# Patient Record
Sex: Female | Born: 1945 | Race: White | Hispanic: No | Marital: Married | Smoking: Former smoker
Health system: Southern US, Community
[De-identification: ages and names within clinical notes are randomized; demographics above are authoritative.]

## PROBLEM LIST (undated history)

## (undated) DIAGNOSIS — F32A Depression, unspecified: Secondary | ICD-10-CM

## (undated) DIAGNOSIS — J449 Chronic obstructive pulmonary disease, unspecified: Secondary | ICD-10-CM

## (undated) DIAGNOSIS — K279 Peptic ulcer, site unspecified, unspecified as acute or chronic, without hemorrhage or perforation: Secondary | ICD-10-CM

## (undated) DIAGNOSIS — I219 Acute myocardial infarction, unspecified: Secondary | ICD-10-CM

## (undated) DIAGNOSIS — K219 Gastro-esophageal reflux disease without esophagitis: Secondary | ICD-10-CM

## (undated) DIAGNOSIS — R251 Tremor, unspecified: Secondary | ICD-10-CM

## (undated) DIAGNOSIS — K5909 Other constipation: Secondary | ICD-10-CM

## (undated) DIAGNOSIS — G8929 Other chronic pain: Secondary | ICD-10-CM

## (undated) DIAGNOSIS — T460X1A Poisoning by cardiac-stimulant glycosides and drugs of similar action, accidental (unintentional), initial encounter: Secondary | ICD-10-CM

## (undated) DIAGNOSIS — I509 Heart failure, unspecified: Secondary | ICD-10-CM

## (undated) DIAGNOSIS — I739 Peripheral vascular disease, unspecified: Secondary | ICD-10-CM

## (undated) DIAGNOSIS — M549 Dorsalgia, unspecified: Secondary | ICD-10-CM

## (undated) DIAGNOSIS — I2510 Atherosclerotic heart disease of native coronary artery without angina pectoris: Secondary | ICD-10-CM

## (undated) DIAGNOSIS — D696 Thrombocytopenia, unspecified: Secondary | ICD-10-CM

## (undated) DIAGNOSIS — R569 Unspecified convulsions: Secondary | ICD-10-CM

## (undated) DIAGNOSIS — Z515 Encounter for palliative care: Secondary | ICD-10-CM

## (undated) DIAGNOSIS — F329 Major depressive disorder, single episode, unspecified: Secondary | ICD-10-CM

## (undated) HISTORY — DX: Unspecified convulsions: R56.9

## (undated) HISTORY — DX: Dorsalgia, unspecified: M54.9

## (undated) HISTORY — DX: Acute myocardial infarction, unspecified: I21.9

## (undated) HISTORY — PX: TUBAL LIGATION: SHX77

## (undated) HISTORY — DX: Encounter for palliative care: Z51.5

## (undated) HISTORY — DX: Peptic ulcer, site unspecified, unspecified as acute or chronic, without hemorrhage or perforation: K27.9

## (undated) HISTORY — DX: Thrombocytopenia, unspecified: D69.6

## (undated) HISTORY — DX: Major depressive disorder, single episode, unspecified: F32.9

## (undated) HISTORY — DX: Other constipation: K59.09

## (undated) HISTORY — DX: Tremor, unspecified: R25.1

## (undated) HISTORY — DX: Other chronic pain: G89.29

## (undated) HISTORY — DX: Chronic obstructive pulmonary disease, unspecified: J44.9

## (undated) HISTORY — DX: Peripheral vascular disease, unspecified: I73.9

## (undated) HISTORY — DX: Poisoning by cardiac-stimulant glycosides and drugs of similar action, accidental (unintentional), initial encounter: T46.0X1A

## (undated) HISTORY — DX: Atherosclerotic heart disease of native coronary artery without angina pectoris: I25.10

## (undated) HISTORY — PX: ANGIOPLASTY: SHX39

## (undated) HISTORY — PX: US ECHOCARDIOGRAPHY: HXRAD669

## (undated) HISTORY — DX: Heart failure, unspecified: I50.9

## (undated) HISTORY — DX: Depression, unspecified: F32.A

## (undated) HISTORY — DX: Gastro-esophageal reflux disease without esophagitis: K21.9

---

## 1995-06-03 HISTORY — PX: AORTO-FEMORAL BYPASS GRAFT: SHX885

## 2001-01-31 HISTORY — PX: CHOLECYSTECTOMY: SHX55

## 2001-06-02 DIAGNOSIS — R569 Unspecified convulsions: Secondary | ICD-10-CM

## 2001-06-02 HISTORY — DX: Unspecified convulsions: R56.9

## 2001-09-30 DIAGNOSIS — I509 Heart failure, unspecified: Secondary | ICD-10-CM

## 2001-09-30 HISTORY — DX: Heart failure, unspecified: I50.9

## 2001-10-31 DIAGNOSIS — T460X1A Poisoning by cardiac-stimulant glycosides and drugs of similar action, accidental (unintentional), initial encounter: Secondary | ICD-10-CM

## 2001-10-31 HISTORY — DX: Poisoning by cardiac-stimulant glycosides and drugs of similar action, accidental (unintentional), initial encounter: T46.0X1A

## 2004-06-04 ENCOUNTER — Ambulatory Visit: Payer: Self-pay | Admitting: Internal Medicine

## 2004-09-06 ENCOUNTER — Ambulatory Visit: Payer: Self-pay | Admitting: Internal Medicine

## 2004-11-05 ENCOUNTER — Ambulatory Visit: Payer: Self-pay | Admitting: Internal Medicine

## 2004-11-06 ENCOUNTER — Ambulatory Visit: Payer: Self-pay | Admitting: Internal Medicine

## 2004-12-23 ENCOUNTER — Ambulatory Visit: Payer: Self-pay | Admitting: Internal Medicine

## 2005-03-28 ENCOUNTER — Ambulatory Visit: Payer: Self-pay | Admitting: Internal Medicine

## 2005-05-19 ENCOUNTER — Ambulatory Visit: Payer: Self-pay | Admitting: Internal Medicine

## 2005-06-30 ENCOUNTER — Ambulatory Visit: Payer: Self-pay | Admitting: Internal Medicine

## 2005-09-29 ENCOUNTER — Ambulatory Visit: Payer: Self-pay | Admitting: Internal Medicine

## 2005-12-29 ENCOUNTER — Ambulatory Visit: Payer: Self-pay | Admitting: Internal Medicine

## 2006-03-30 ENCOUNTER — Ambulatory Visit: Payer: Self-pay | Admitting: Internal Medicine

## 2006-06-02 DIAGNOSIS — K279 Peptic ulcer, site unspecified, unspecified as acute or chronic, without hemorrhage or perforation: Secondary | ICD-10-CM

## 2006-06-02 HISTORY — DX: Peptic ulcer, site unspecified, unspecified as acute or chronic, without hemorrhage or perforation: K27.9

## 2006-06-29 ENCOUNTER — Ambulatory Visit: Payer: Self-pay | Admitting: Internal Medicine

## 2006-06-29 LAB — CONVERTED CEMR LAB
Calcium: 9.1 mg/dL (ref 8.4–10.5)
Cholesterol: 202 mg/dL (ref 0–200)
Creatinine,U: 329.3 mg/dL
Direct LDL: 149.2 mg/dL
GFR calc Af Amer: 94 mL/min
GFR calc non Af Amer: 78 mL/min
Glucose, Bld: 121 mg/dL — ABNORMAL HIGH (ref 70–99)
Hgb A1c MFr Bld: 5.5 % (ref 4.6–6.0)
Potassium: 3.8 meq/L (ref 3.5–5.1)
Triglycerides: 112 mg/dL (ref 0–149)
VLDL: 22 mg/dL (ref 0–40)

## 2006-09-07 DIAGNOSIS — F3340 Major depressive disorder, recurrent, in remission, unspecified: Secondary | ICD-10-CM

## 2006-09-07 DIAGNOSIS — I2510 Atherosclerotic heart disease of native coronary artery without angina pectoris: Secondary | ICD-10-CM

## 2006-09-07 DIAGNOSIS — K219 Gastro-esophageal reflux disease without esophagitis: Secondary | ICD-10-CM

## 2006-09-07 DIAGNOSIS — E7800 Pure hypercholesterolemia, unspecified: Secondary | ICD-10-CM

## 2006-09-07 DIAGNOSIS — I498 Other specified cardiac arrhythmias: Secondary | ICD-10-CM | POA: Insufficient documentation

## 2006-09-07 DIAGNOSIS — J4489 Other specified chronic obstructive pulmonary disease: Secondary | ICD-10-CM | POA: Insufficient documentation

## 2006-09-07 DIAGNOSIS — I739 Peripheral vascular disease, unspecified: Secondary | ICD-10-CM | POA: Insufficient documentation

## 2006-09-07 DIAGNOSIS — J449 Chronic obstructive pulmonary disease, unspecified: Secondary | ICD-10-CM

## 2006-09-28 ENCOUNTER — Ambulatory Visit: Payer: Self-pay | Admitting: Internal Medicine

## 2006-09-28 DIAGNOSIS — M549 Dorsalgia, unspecified: Secondary | ICD-10-CM

## 2006-09-28 DIAGNOSIS — G8929 Other chronic pain: Secondary | ICD-10-CM

## 2006-12-22 ENCOUNTER — Encounter (INDEPENDENT_AMBULATORY_CARE_PROVIDER_SITE_OTHER): Payer: Self-pay | Admitting: *Deleted

## 2006-12-28 ENCOUNTER — Ambulatory Visit: Payer: Self-pay | Admitting: Internal Medicine

## 2006-12-28 LAB — CONVERTED CEMR LAB
ALT: 12 units/L (ref 0–35)
Albumin: 3.8 g/dL (ref 3.5–5.2)
Basophils Absolute: 0 10*3/uL (ref 0.0–0.1)
Basophils Relative: 0.3 % (ref 0.0–1.0)
Bilirubin, Direct: 0.2 mg/dL (ref 0.0–0.3)
Calcium: 9.4 mg/dL (ref 8.4–10.5)
Eosinophils Absolute: 0.3 10*3/uL (ref 0.0–0.6)
Eosinophils Relative: 2.9 % (ref 0.0–5.0)
GFR calc Af Amer: 73 mL/min
Hemoglobin: 14.9 g/dL (ref 12.0–15.0)
Hgb A1c MFr Bld: 5.5 % (ref 4.6–6.0)
Lymphocytes Relative: 27.2 % (ref 12.0–46.0)
MCHC: 34.5 g/dL (ref 30.0–36.0)
MCV: 97.4 fL (ref 78.0–100.0)
Monocytes Absolute: 1.1 10*3/uL — ABNORMAL HIGH (ref 0.2–0.7)
Monocytes Relative: 11.1 % — ABNORMAL HIGH (ref 3.0–11.0)
Neutro Abs: 5.7 10*3/uL (ref 1.4–7.7)
Neutrophils Relative %: 58.5 % (ref 43.0–77.0)
Phosphorus: 3.6 mg/dL (ref 2.3–4.6)
Platelets: 183 10*3/uL (ref 150–400)
Total Bilirubin: 0.9 mg/dL (ref 0.3–1.2)
Total Protein: 7.2 g/dL (ref 6.0–8.3)
WBC: 9.7 10*3/uL (ref 4.5–10.5)

## 2007-01-05 ENCOUNTER — Telehealth (INDEPENDENT_AMBULATORY_CARE_PROVIDER_SITE_OTHER): Payer: Self-pay | Admitting: *Deleted

## 2007-01-20 ENCOUNTER — Ambulatory Visit: Payer: Self-pay | Admitting: Internal Medicine

## 2007-01-20 DIAGNOSIS — K279 Peptic ulcer, site unspecified, unspecified as acute or chronic, without hemorrhage or perforation: Secondary | ICD-10-CM | POA: Insufficient documentation

## 2007-03-29 ENCOUNTER — Ambulatory Visit: Payer: Self-pay | Admitting: Internal Medicine

## 2007-06-28 ENCOUNTER — Ambulatory Visit: Payer: Self-pay | Admitting: Internal Medicine

## 2007-06-29 LAB — CONVERTED CEMR LAB
Hgb A1c MFr Bld: 5.6 % (ref 4.6–6.0)
Phosphorus: 3.7 mg/dL (ref 2.3–4.6)

## 2007-08-12 ENCOUNTER — Ambulatory Visit: Payer: Self-pay | Admitting: Family Medicine

## 2007-08-16 ENCOUNTER — Ambulatory Visit: Payer: Self-pay | Admitting: Internal Medicine

## 2007-09-28 ENCOUNTER — Ambulatory Visit: Payer: Self-pay | Admitting: Internal Medicine

## 2007-09-30 LAB — CONVERTED CEMR LAB
Albumin: 3.7 g/dL (ref 3.5–5.2)
Basophils Absolute: 0.1 10*3/uL (ref 0.0–0.1)
Basophils Relative: 2.2 % — ABNORMAL HIGH (ref 0.0–1.0)
CO2: 34 meq/L — ABNORMAL HIGH (ref 19–32)
Chloride: 103 meq/L (ref 96–112)
Creatinine, Ser: 0.7 mg/dL (ref 0.4–1.2)
Eosinophils Absolute: 0.3 10*3/uL (ref 0.0–0.7)
HCT: 46 % (ref 36.0–46.0)
Hemoglobin: 15.2 g/dL — ABNORMAL HIGH (ref 12.0–15.0)
Hgb A1c MFr Bld: 5.6 % (ref 4.6–6.0)
MCHC: 33.1 g/dL (ref 30.0–36.0)
MCV: 98.2 fL (ref 78.0–100.0)
Neutro Abs: 3.1 10*3/uL (ref 1.4–7.7)
Neutrophils Relative %: 46.1 % (ref 43.0–77.0)
Potassium: 3.9 meq/L (ref 3.5–5.1)
RDW: 12.6 % (ref 11.5–14.6)
Sodium: 142 meq/L (ref 135–145)
TSH: 2.66 microintl units/mL (ref 0.35–5.50)
WBC: 6.6 10*3/uL (ref 4.5–10.5)

## 2007-12-21 ENCOUNTER — Ambulatory Visit: Payer: Self-pay | Admitting: Internal Medicine

## 2008-03-22 ENCOUNTER — Ambulatory Visit: Payer: Self-pay | Admitting: Internal Medicine

## 2008-03-29 ENCOUNTER — Telehealth: Payer: Self-pay | Admitting: Internal Medicine

## 2008-04-12 ENCOUNTER — Telehealth: Payer: Self-pay | Admitting: Family Medicine

## 2008-06-26 ENCOUNTER — Ambulatory Visit: Payer: Self-pay | Admitting: Internal Medicine

## 2008-06-26 LAB — HM DIABETES FOOT EXAM

## 2008-06-27 LAB — CONVERTED CEMR LAB
Basophils Absolute: 0.1 10*3/uL (ref 0.0–0.1)
CO2: 35 meq/L — ABNORMAL HIGH (ref 19–32)
Calcium: 9.4 mg/dL (ref 8.4–10.5)
Creatinine, Ser: 0.8 mg/dL (ref 0.4–1.2)
Eosinophils Absolute: 0.2 10*3/uL (ref 0.0–0.7)
GFR calc non Af Amer: 78 mL/min
Glucose, Bld: 109 mg/dL — ABNORMAL HIGH (ref 70–99)
HCT: 43.4 % (ref 36.0–46.0)
Hemoglobin: 15 g/dL (ref 12.0–15.0)
Hgb A1c MFr Bld: 5.6 % (ref 4.6–6.0)
Monocytes Absolute: 0.4 10*3/uL (ref 0.1–1.0)
Neutrophils Relative %: 54.6 % (ref 43.0–77.0)
Potassium: 4.4 meq/L (ref 3.5–5.1)
TSH: 0.89 microintl units/mL (ref 0.35–5.50)
WBC: 6.3 10*3/uL (ref 4.5–10.5)

## 2008-07-13 ENCOUNTER — Encounter: Payer: Self-pay | Admitting: Internal Medicine

## 2008-07-20 ENCOUNTER — Telehealth: Payer: Self-pay | Admitting: Internal Medicine

## 2008-09-14 ENCOUNTER — Telehealth: Payer: Self-pay | Admitting: Internal Medicine

## 2008-09-18 ENCOUNTER — Ambulatory Visit: Payer: Self-pay | Admitting: Internal Medicine

## 2008-12-21 ENCOUNTER — Telehealth: Payer: Self-pay | Admitting: Internal Medicine

## 2008-12-25 ENCOUNTER — Ambulatory Visit: Payer: Self-pay | Admitting: Internal Medicine

## 2008-12-27 LAB — CONVERTED CEMR LAB
AST: 24 units/L (ref 0–37)
Alkaline Phosphatase: 82 units/L (ref 39–117)
Bilirubin, Direct: 0.2 mg/dL (ref 0.0–0.3)
Chloride: 97 meq/L (ref 96–112)
Eosinophils Absolute: 0.3 10*3/uL (ref 0.0–0.7)
Eosinophils Relative: 4.4 % (ref 0.0–5.0)
HCT: 42.9 % (ref 36.0–46.0)
Hemoglobin: 14.9 g/dL (ref 12.0–15.0)
Hgb A1c MFr Bld: 5.4 % (ref 4.6–6.5)
MCHC: 34.8 g/dL (ref 30.0–36.0)
MCV: 97.8 fL (ref 78.0–100.0)
Neutro Abs: 3.7 10*3/uL (ref 1.4–7.7)
Platelets: 142 10*3/uL — ABNORMAL LOW (ref 150.0–400.0)
Potassium: 4.1 meq/L (ref 3.5–5.1)
RBC: 4.38 M/uL (ref 3.87–5.11)
RDW: 12.2 % (ref 11.5–14.6)
Sodium: 140 meq/L (ref 135–145)
TSH: 2.3 microintl units/mL (ref 0.35–5.50)
Total Bilirubin: 0.9 mg/dL (ref 0.3–1.2)
WBC: 6.4 10*3/uL (ref 4.5–10.5)

## 2009-02-21 ENCOUNTER — Telehealth: Payer: Self-pay | Admitting: Internal Medicine

## 2009-02-28 ENCOUNTER — Encounter: Payer: Self-pay | Admitting: Internal Medicine

## 2009-03-16 ENCOUNTER — Ambulatory Visit: Payer: Self-pay | Admitting: Internal Medicine

## 2009-03-16 DIAGNOSIS — I5022 Chronic systolic (congestive) heart failure: Secondary | ICD-10-CM

## 2009-04-02 ENCOUNTER — Telehealth: Payer: Self-pay | Admitting: Internal Medicine

## 2009-04-02 ENCOUNTER — Encounter (INDEPENDENT_AMBULATORY_CARE_PROVIDER_SITE_OTHER): Payer: Self-pay | Admitting: *Deleted

## 2009-04-13 ENCOUNTER — Ambulatory Visit: Payer: Self-pay | Admitting: Gastroenterology

## 2009-04-16 ENCOUNTER — Telehealth: Payer: Self-pay | Admitting: Gastroenterology

## 2009-04-23 ENCOUNTER — Ambulatory Visit: Payer: Self-pay | Admitting: Gastroenterology

## 2009-05-02 ENCOUNTER — Telehealth: Payer: Self-pay | Admitting: Internal Medicine

## 2009-05-03 ENCOUNTER — Ambulatory Visit (HOSPITAL_COMMUNITY): Admission: RE | Admit: 2009-05-03 | Discharge: 2009-05-03 | Payer: Self-pay | Admitting: Gastroenterology

## 2009-05-03 ENCOUNTER — Telehealth: Payer: Self-pay | Admitting: Gastroenterology

## 2009-05-04 ENCOUNTER — Telehealth: Payer: Self-pay | Admitting: Gastroenterology

## 2009-06-15 ENCOUNTER — Ambulatory Visit: Payer: Self-pay | Admitting: Internal Medicine

## 2009-06-18 ENCOUNTER — Telehealth: Payer: Self-pay | Admitting: Internal Medicine

## 2009-09-10 ENCOUNTER — Ambulatory Visit: Payer: Self-pay | Admitting: Internal Medicine

## 2009-09-10 LAB — CONVERTED CEMR LAB
AST: 18 units/L (ref 0–37)
BUN: 9 mg/dL (ref 6–23)
Basophils Absolute: 0 10*3/uL (ref 0.0–0.1)
Basophils Relative: 0.5 % (ref 0.0–3.0)
CO2: 36 meq/L — ABNORMAL HIGH (ref 19–32)
Calcium: 8.8 mg/dL (ref 8.4–10.5)
Chloride: 96 meq/L (ref 96–112)
Creatinine, Ser: 0.8 mg/dL (ref 0.4–1.2)
GFR calc non Af Amer: 77.09 mL/min (ref >60)
Lymphocytes Relative: 31.6 % (ref 12.0–46.0)
MCHC: 35 g/dL (ref 30.0–36.0)
MCV: 98.6 fL (ref 78.0–100.0)
Neutro Abs: 3.9 10*3/uL (ref 1.4–7.7)
Neutrophils Relative %: 56.4 % (ref 43.0–77.0)
Potassium: 3.5 meq/L (ref 3.5–5.1)
RBC: 4.05 M/uL (ref 3.87–5.11)
Sodium: 140 meq/L (ref 135–145)
TSH: 2.19 microintl units/mL (ref 0.35–5.50)
Total Protein: 7.3 g/dL (ref 6.0–8.3)

## 2009-11-08 ENCOUNTER — Telehealth: Payer: Self-pay | Admitting: Gastroenterology

## 2009-11-08 ENCOUNTER — Ambulatory Visit: Payer: Self-pay | Admitting: Gastroenterology

## 2009-11-15 ENCOUNTER — Telehealth: Payer: Self-pay | Admitting: Gastroenterology

## 2009-11-15 ENCOUNTER — Ambulatory Visit: Payer: Self-pay | Admitting: Gastroenterology

## 2009-11-15 ENCOUNTER — Ambulatory Visit (HOSPITAL_COMMUNITY): Admission: RE | Admit: 2009-11-15 | Discharge: 2009-11-15 | Payer: Self-pay | Admitting: Gastroenterology

## 2009-11-16 ENCOUNTER — Encounter (INDEPENDENT_AMBULATORY_CARE_PROVIDER_SITE_OTHER): Payer: Self-pay | Admitting: *Deleted

## 2009-11-22 ENCOUNTER — Encounter: Payer: Self-pay | Admitting: Gastroenterology

## 2009-11-23 ENCOUNTER — Ambulatory Visit: Payer: Self-pay | Admitting: Internal Medicine

## 2009-11-23 LAB — CONVERTED CEMR LAB
Glucose, Urine, Semiquant: NEGATIVE
Ketones, urine, test strip: NEGATIVE
Urobilinogen, UA: 0.2
pH: 8

## 2009-11-29 ENCOUNTER — Ambulatory Visit (HOSPITAL_COMMUNITY): Admission: RE | Admit: 2009-11-29 | Discharge: 2009-11-29 | Payer: Self-pay | Admitting: Gastroenterology

## 2009-12-10 ENCOUNTER — Ambulatory Visit: Payer: Self-pay | Admitting: Internal Medicine

## 2010-03-14 ENCOUNTER — Ambulatory Visit: Payer: Self-pay | Admitting: Internal Medicine

## 2010-06-14 ENCOUNTER — Ambulatory Visit
Admission: RE | Admit: 2010-06-14 | Discharge: 2010-06-14 | Payer: Self-pay | Source: Home / Self Care | Attending: Internal Medicine | Admitting: Internal Medicine

## 2010-06-14 DIAGNOSIS — G252 Other specified forms of tremor: Secondary | ICD-10-CM

## 2010-06-14 DIAGNOSIS — G250 Essential tremor: Secondary | ICD-10-CM | POA: Insufficient documentation

## 2010-07-02 NOTE — Assessment & Plan Note (Signed)
Summary: ? UTI   Vital Signs:  Patient profile:   65 year old female Weight:      156 pounds O2 Sat:      96 % on 2 L/min Temp:     98.9 degrees F oral Pulse rate:   122 / minute Pulse rhythm:   regular BP sitting:   100 / 60  (left arm) Cuff size:   regular  Vitals Entered By: Mervin Hack CMA Duncan Dull) (November 23, 2009 11:16 AM)  O2 Flow:  2 L/min CC: UTI   History of Present Illness: Started feeling sick upon awakening 2 days ago bad dysuria  Slight blood tinging in urine No fever but hasn't felt well--just lying around all week  Nausea able to eat some  Hasn't used any meds for this did use cranberry juice in the past--not lately  Allergies: No Known Drug Allergies  Past History:  Past medical, surgical, family and social histories (including risk factors) reviewed for relevance to current acute and chronic problems.  Past Medical History: Reviewed history from 11/08/2009 and no changes required. Congestive heart failure ?diastolic (with SVT) 5/03 COPD Coronary artery disease Depression GERD Peripheral vascular disease Peptic ulcer disease--2008   + H pylori and classic symptoms (antibiotics given)  Also in 20's Chronic back pain Chronic constipation  Past Surgical History: Reviewed history from 04/13/2009 and no changes required. Cholecystectomy 9/02 1997 Aorto-bifem Hysterectomy  MI multiple Multiple angioplasties  Tubal ligation Seizure vs TIA 2003 Echo- EF 77% 6/03  dig toxicity Hospice due to severe CHF 2003-----probably due to actos (or avandia)  Family History: Reviewed history from 04/13/2009 and no changes required. Nieces with cystic fibrosis Soster with pulmonary fibrosis Family History of Breast Cancer: Sister No FH of Colon Cancer: Family History of Diabetes: Brother Family History of Heart Disease: Mother  Social History: Reviewed history from 04/13/2009 and no changes required. Retired--textile worker Married--2  children Former Smoker-stopped 3.5 years ago Alcohol use-no Illicit Drug Use - no Patient does not get regular exercise.   Review of Systems       just had endoscopy ongoing eval by Dr Arlyce Dice  Physical Exam  General:  alert.  Appears mildly ill but no distress Neck:  supple and no cervical lymphadenopathy.   Abdomen:  soft and non-tender.   Msk:  no sig CVA tenderness   Impression & Recommendations:  Problem # 1:  UTI (ICD-599.0) Assessment New  dysuria and mild systemic symptoms  will treat with amoxil phenergan as needed   Her updated medication list for this problem includes:    Amoxicillin 500 Mg Tabs (Amoxicillin) .Marland Kitchen... 1 tab by mouth two times a day for urinary tract infection  Orders: UA Dipstick w/o Micro (manual) ([address])  Complete Medication List: 1)  Omeprazole 20 Mg Cpdr (Omeprazole) .... Take one by mouth two times a day 2)  Paroxetine Hcl 40 Mg Tabs (Paroxetine hcl) .... Take one by mouth once a day 3)  Morphine Sulfate 15 Mg Tabs (Morphine sulfate) .... One tablet by mouth ever morining and at bedtime 4)  Lasix 40 Mg Tabs (Furosemide) .... Take one by mouth once a day as needed 5)  Mineral Oil Oil (Mineral oil) .... Take 1 teaspoon at bedtime 6)  Nebulizer Misc (Nebulizers) .... As needed 7)  Miralax Powd (Polyethylene glycol 3350) .Marland Kitchen.. 1 capful with water two times a day 8)  Aspirin Low Dose 81 Mg Tabs (Aspirin) .... Take one by mouth once a day 9)  Senokot S  8.6-50 Mg Tabs (Sennosides-docusate sodium) .... Take 2 tabs two times a day 10)  Amoxicillin 500 Mg Tabs (Amoxicillin) .Marland Kitchen.. 1 tab by mouth two times a day for urinary tract infection 11)  Promethazine Hcl 25 Mg Tabs (Promethazine hcl) .... 1/2-1 tab by mouth three times a day as needed for nausea  Patient Instructions: 1)  Please call if urinary symptoms not better by Monday 2)  Keep regular follow up appt Prescriptions: PROMETHAZINE HCL 25 MG TABS (PROMETHAZINE HCL) 1/2-1 tab by mouth three  times a day as needed for nausea  #20 x 0   Entered and Authorized by:   Cindee Salt MD   Signed by:   Cindee Salt MD on 11/23/2009   Method used:   Electronically to        [facility]  [address] [phone]* (retail)       [address]       [place], [place]  [address]       Ph: [phone]       Fax: [phone]   RxID:   [id number] AMOXICILLIN 500 MG TABS (AMOXICILLIN) 1 tab by mouth two times a day for urinary tract infection  #20 x 0   Entered and Authorized by:   Cindee Salt MD   Signed by:   Cindee Salt MD on 11/23/2009   Method used:   Electronically to        [facility]  [address] [phone]* (retail)       [address]       [place], [place]  [address]       Ph: [phone]       Fax: [phone]   RxID:   [id number]   Laboratory Results   Urine Tests  Date/Time Received: November 23, 2009 11:13 AM Date/Time Reported: November 23, 2009 11:13 AM  Routine Urinalysis   Color: orange Appearance: Cloudy Glucose: negative   (Normal Range: Negative) Bilirubin: large   (Normal Range: Negative) Ketone: negative   (Normal Range: Negative) Spec. Gravity: >=1.030   (Normal Range: 1.003-1.035) Blood: large   (Normal Range: Negative) pH: 8.0   (Normal Range: 5.0-8.0) Protein: 30   (Normal Range: Negative) Urobilinogen: 0.2   (Normal Range: 0-1) Nitrite: negative   (Normal Range: Negative) Leukocyte Esterace: moderate   (Normal Range: Negative)        Current Allergies (reviewed today): No known allergies

## 2010-07-02 NOTE — Assessment & Plan Note (Signed)
Summary: 3 MONTH FOLLOW UP/[facility]   Vital Signs:  Patient profile:   65 year old female Weight:      158 pounds O2 Sat:      96 % on 2 L/min Temp:     98.3 degrees F oral Pulse rate:   98 / minute Pulse rhythm:   regular Resp:     18 per minute BP sitting:   100 / 70  (left arm) Cuff size:   regular  Vitals Entered By: Mervin Hack CMA [facility] Dull) (June 15, 2009 2:42 PM)  O2 Flow:  2 L/min CC: 3 month follow-up   History of Present Illness: Doing well  stomach has settled down had GI eval and then colonoscopy Actually lost 8# in 1 day due to the vomiting from the moviprep Abd pain is better hyomax really helps  Breathing still not great able to do about the same Not able to do housework for the most part Can briefly carry light groceries Needs cough medicine occ  Chronic back pain still controlled with current morphine  sugars remain normal at this point checks about once a week  No problems with depression  Allergies: No Known Drug Allergies  Past History:  Past medical, surgical, family and social histories (including risk factors) reviewed for relevance to current acute and chronic problems.  Past Medical History: Reviewed history from 12/21/2007 and no changes required. Congestive heart failure ?diastolic (with SVT) 5/03 COPD Coronary artery disease Depression Diabetes mellitus, type II GERD Peripheral vascular disease Peptic ulcer disease--2008   + H pylori and classic symptoms (antibiotics given)  Also in 20's Chronic back pain  Past Surgical History: Reviewed history from 04/13/2009 and no changes required. Cholecystectomy 9/02 1997 Aorto-bifem Hysterectomy  MI multiple Multiple angioplasties  Tubal ligation Seizure vs TIA 2003 Echo- EF 77% 6/03  dig toxicity Hospice due to severe CHF 2003-----probably due to actos (or avandia)  Family History: Reviewed history from 04/13/2009 and no changes required. Nieces with cystic  fibrosis Soster with pulmonary fibrosis Family History of Breast Cancer: Sister No FH of Colon Cancer: Family History of Diabetes: Brother Family History of Heart Disease: Mother  Social History: Reviewed history from 04/13/2009 and no changes required. 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   Review of Systems       weight down 7# from last time--thinks this is closer to her dry baseline (had held off on diuretic before last visit) sleeps okay  Physical Exam  General:  alert.  NAD Neck:  supple, no masses, no thyromegaly, no carotid bruits, and no cervical lymphadenopathy.   Lungs:  normal respiratory effort, no intercostal retractions, no accessory muscle use, no crackles, and no wheezes.  Decreased breath sounds but clear Heart:  normal rate, regular rhythm, no murmur, and no gallop.   Abdomen:  soft and non-tender.   Extremities:  no edema Psych:  normally interactive, good eye contact, not anxious appearing, and not depressed appearing.     Impression & Recommendations:  Problem # 1:  CHRONIC SYSTOLIC HEART FAILURE (ICD-428.22) Assessment Comment Only stable status no changes needed  Her updated medication list for this problem includes:    Toprol Xl 50 Mg Tb24 (Metoprolol succinate) .Marland Kitchen... Take one by mouth once a day    Aspirin Low Dose 81 Mg Tabs (Aspirin) .Marland Kitchen... Take one by mouth once a day    Lasix 40 Mg Tabs (Furosemide) .Marland KitchenMarland KitchenMarland KitchenMarland Kitchen  Take one by mouth once a day as needed  Problem # 2:  BACK PAIN, CHRONIC (ICD-724.5) Assessment: Unchanged doing okay on the morphine  Her updated medication list for this problem includes:    Ms Contin 60 Mg Tb12 (Morphine sulfate) .Marland Kitchen... 1 two times a day    Aspirin Low Dose 81 Mg Tabs (Aspirin) .Marland Kitchen... Take one by mouth once a day    Ms Contin 60 Mg Xr12h-tab (Morphine sulfate) .Marland Kitchen... 1 two times a day  fill in about 1  month    Ms Contin 60 Mg Xr12h-tab (Morphine sulfate) .Marland Kitchen... 1 two times a day  fill in about 2 months    Morphine Sulfate 15 Mg Tabs (Morphine sulfate) .Marland Kitchen... 1 by mouth as needed for chest pain unresponsive to nitroglycerin  Problem # 3:  COPD (ICD-496) Assessment: Unchanged stable status moderate restriction on oxygen  Problem # 4:  CORONARY ARTERY DISEASE (ICD-414.00) Assessment: Unchanged quiet  Her updated medication list for this problem includes:    Toprol Xl 50 Mg Tb24 (Metoprolol succinate) .Marland Kitchen... Take one by mouth once a day    Aspirin Low Dose 81 Mg Tabs (Aspirin) .Marland Kitchen... Take one by mouth once a day    Lasix 40 Mg Tabs (Furosemide) .Marland Kitchen... Take one by mouth once a day as needed  Problem # 5:  DEPRESSION (ICD-311) Assessment: Unchanged doing okay  Her updated medication list for this problem includes:    Paroxetine Hcl 40 Mg Tabs (Paroxetine hcl) .Marland Kitchen... Take one by mouth once a day  Complete Medication List: 1)  Omeprazole 20 Mg Cpdr (Omeprazole) .... Take one by mouth two times a day 2)  Paroxetine Hcl 40 Mg Tabs (Paroxetine hcl) .... Take one by mouth once a day 3)  Toprol Xl 50 Mg Tb24 (Metoprolol succinate) .... Take one by mouth once a day 4)  Ms Contin 60 Mg Tb12 (Morphine sulfate) .Marland Kitchen.. 1 two times a day 5)  Aspirin Low Dose 81 Mg Tabs (Aspirin) .... Take one by mouth once a day 6)  Senokot S 8.6-50 Mg Tabs (Sennosides-docusate sodium) .... Take 2 tabs two times a day 7)  Oxygen  8)  Lasix 40 Mg Tabs (Furosemide) .... Take one by mouth once a day as needed 9)  Ms Contin 60 Mg Xr12h-tab (Morphine sulfate) .Marland Kitchen.. 1 two times a day  fill in about 1 month 10)  Ms Contin 60 Mg Xr12h-tab (Morphine sulfate) .Marland Kitchen.. 1 two times a day  fill in about 2 months 11)  Morphine Sulfate 15 Mg Tabs (Morphine sulfate) .Marland Kitchen.. 1 by mouth as needed for chest pain unresponsive to nitroglycerin 12)  Mineral Oil Oil (Mineral oil) .... Take 1 teaspoon at bedtime 13)  Nebulizer Misc (Nebulizers) ....  As needed 14)  Miralax Powd (Polyethylene glycol 3350) .Marland Kitchen.. 1 capful with water two times a day 15)  Ondansetron 4 Mg Tbdp (Ondansetron) .... Take one by mouth every 6 hours as needed for nausea. 16)  Hydrocodone-homatropine 5-1.5 Mg/50ml Syrp (Hydrocodone-homatropine) .Marland Kitchen.. 1-2 teaspoons at bedtime as needed for severe cough  Patient Instructions: 1)  Please schedule a follow-up appointment in 3 months .  Prescriptions: MS CONTIN 60 MG  XR12H-TAB (MORPHINE SULFATE) 1 two times a day  FILL IN ABOUT 2 MONTHS  #60 x 0   Entered and Authorized by:   Cindee Salt MD   Signed by:   Cindee Salt MD on 06/15/2009   Method used:   Print then Give to Patient  RxID:   [id number] MS CONTIN 60 MG  XR12H-TAB (MORPHINE SULFATE) 1 two times a day  FILL IN ABOUT 1 MONTH  #60 x 0   Entered and Authorized by:   Cindee Salt MD   Signed by:   Cindee Salt MD on 06/15/2009   Method used:   Print then Give to Patient   RxID:   [id number] MS CONTIN 60 MG TB12 (MORPHINE SULFATE) 1 two times a day  #60 x 0   Entered and Authorized by:   Cindee Salt MD   Signed by:   Cindee Salt MD on 06/15/2009   Method used:   Print then Give to Patient   RxID:   [id number] HYDROCODONE-HOMATROPINE 5-1.5 MG/5ML SYRP (HYDROCODONE-HOMATROPINE) 1-2 teaspoons at bedtime as needed for severe cough  #8oz x 0   Entered and Authorized by:   Cindee Salt MD   Signed by:   Cindee Salt MD on 06/15/2009   Method used:   Print then Give to Patient   RxID:   [id number] TOPROL XL 50 MG TB24 (METOPROLOL SUCCINATE) Take one by mouth once a day  #90 x 3   Entered by:   Mervin Hack CMA (AAMA)   Authorized by:   Cindee Salt MD   Signed by:   Mervin Hack CMA (AAMA) on 06/15/2009   Method used:   Print then Give to Patient   RxID:   [id number] PAROXETINE HCL 40 MG TABS (PAROXETINE HCL) Take one by mouth once a day  #90 x 3   Entered by:   Mervin Hack CMA (AAMA)   Authorized by:   Cindee Salt MD   Signed by:   Mervin Hack CMA (AAMA) on 06/15/2009   Method used:   Print then Give to Patient   RxID:   [id number] OMEPRAZOLE 20 MG CPDR (OMEPRAZOLE) Take one by mouth two times a day  #180 x 3   Entered by:   Mervin Hack CMA (AAMA)   Authorized by:   Cindee Salt MD   Signed by:   Mervin Hack CMA (AAMA) on 06/15/2009   Method used:   Print then Give to Patient   RxID:   [id number]   Current Allergies (reviewed today): No known allergies

## 2010-07-02 NOTE — Assessment & Plan Note (Signed)
Summary: 3 M F/U DLO   Vital Signs:  Patient profile:   65 year old female Weight:      150 pounds O2 Sat:      97 % on 2 L/min Temp:     98.5 degrees F oral Pulse rate:   64 / minute Pulse rhythm:   regular BP sitting:   100 / 60  (left arm) Cuff size:   regular  Vitals Entered By: Mervin Hack CMA [facility] Dull) (March 14, 2010 4:07 PM)  O2 Flow:  2 L/min CC: 3 month follow-up   History of Present Illness: Doing okay No new concerns  still satisfied with pain control regular with the meds  stomach much better since on the reglan not much appetite   Breathing is about the same still with limited exercise tolerance Can walk from car to house if she takes it slow. Dyspnea if she carries anything No cough  No chest pain No palpitations No sig edema  Allergies: No Known Drug Allergies  Past History:  Past medical, surgical, family and social histories (including risk factors) reviewed for relevance to current acute and chronic problems.  Past Medical History: Reviewed history from 11/08/2009 and no changes required. Congestive heart failure ?diastolic (with SVT) 5/03 COPD Coronary artery disease Depression GERD Peripheral vascular disease Peptic ulcer disease--2008   + H pylori and classic symptoms (antibiotics given)  Also in 20's Chronic back pain Chronic constipation  Past Surgical History: Reviewed history from 04/13/2009 and no changes required. Cholecystectomy 9/02 1997 Aorto-bifem Hysterectomy  MI multiple Multiple angioplasties  Tubal ligation Seizure vs TIA 2003 Echo- EF 77% 6/03  dig toxicity Hospice due to severe CHF 2003-----probably due to actos (or avandia)  Family History: Reviewed history from 04/13/2009 and no changes required. Nieces with cystic fibrosis Soster with pulmonary fibrosis Family History of Breast Cancer: Sister No FH of Colon Cancer: Family History of Diabetes: Brother Family History of Heart Disease:  Mother  Social History: Reviewed history from 04/13/2009 and no changes required. 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   Review of Systems       weight down 6# sleeps okay--has occ nocturia does note mild increase in tremor--- has to use spoon more, fork can be difficult  Physical Exam  General:  alert and normal appearance.   Neck:  supple, no masses, no thyromegaly, no carotid bruits, and no cervical lymphadenopathy.   Lungs:  normal respiratory effort, no intercostal retractions, and no accessory muscle use.  Decreased breath sounds but clear Heart:  normal rate, regular rhythm, no murmur, and no gallop.   Abdomen:  soft and non-tender.   Pulses:  1+ in feet Extremities:  no edema Psych:  normally interactive, good eye contact, not anxious appearing, and not depressed appearing.     Impression & Recommendations:  Problem # 1:  COPD (ICD-496) Assessment Unchanged stable resp status NYHA class 3 symptoms  Problem # 2:  CHRONIC SYSTOLIC HEART FAILURE (ICD-428.22) Assessment: Unchanged stable status neutral fluid status  Her updated medication list for this problem includes:    Lasix 40 Mg Tabs (Furosemide) .Marland Kitchen... Take one by mouth once a day as needed    Aspirin Low Dose 81 Mg Tabs (Aspirin) .Marland Kitchen... Take one by mouth once a day  Problem # 3:  BACK PAIN, CHRONIC (ICD-724.5) Assessment: Unchanged stable on morphine will continue  Her updated medication list for this  problem includes:    Morphine Sulfate 15 Mg Tabs (Morphine sulfate) ..... One tablet by mouth ever morining and at bedtime    Aspirin Low Dose 81 Mg Tabs (Aspirin) .Marland Kitchen... Take one by mouth once a day    Morphine Sulfate Cr 60 Mg Xr12h-tab (Morphine sulfate) .Marland Kitchen... 1 tab by mouth two times a day for chronic back pain    Morphine Sulfate Cr 60 Mg Xr12h-tab (Morphine sulfate) .Marland Kitchen... 1 tab by  mouth two times a day for chronic back pain fill in about 1 month    Morphine Sulfate Cr 60 Mg Xr12h-tab (Morphine sulfate) .Marland Kitchen... 1 tab by mouth two times a day for chronic back pain  fill in about 2 months  Problem # 4:  DEPRESSION (ICD-311) Assessment: Unchanged mood is stable  Her updated medication list for this problem includes:    Paroxetine Hcl 40 Mg Tabs (Paroxetine hcl) .Marland Kitchen... Take one by mouth once a day  Problem # 5:  CORONARY ARTERY DISEASE (ICD-414.00) Assessment: Comment Only no symptoms  Her updated medication list for this problem includes:    Lasix 40 Mg Tabs (Furosemide) .Marland Kitchen... Take one by mouth once a day as needed    Aspirin Low Dose 81 Mg Tabs (Aspirin) .Marland Kitchen... Take one by mouth once a day  Complete Medication List: 1)  Reglan 10 Mg Tabs (Metoclopramide hcl) .... Take 1 by mouth 30 minutes before breakfast, lunch, dinner and at bedtime. 2)  Omeprazole 20 Mg Cpdr (Omeprazole) .... Take one by mouth two times a day 3)  Paroxetine Hcl 40 Mg Tabs (Paroxetine hcl) .... Take one by mouth once a day 4)  Morphine Sulfate 15 Mg Tabs (Morphine sulfate) .... One tablet by mouth ever morining and at bedtime 5)  Lasix 40 Mg Tabs (Furosemide) .... Take one by mouth once a day as needed 6)  Mineral Oil Oil (Mineral oil) .... Take 1 teaspoon at bedtime 7)  Nebulizer Misc (Nebulizers) .... As needed 8)  Miralax Powd (Polyethylene glycol 3350) .Marland Kitchen.. 1 capful with water two times a day 9)  Aspirin Low Dose 81 Mg Tabs (Aspirin) .... Take one by mouth once a day 10)  Senokot S 8.6-50 Mg Tabs (Sennosides-docusate sodium) .... Take 2 tabs two times a day 11)  Morphine Sulfate Cr 60 Mg Xr12h-tab (Morphine sulfate) .Marland Kitchen.. 1 tab by mouth two times a day for chronic back pain 12)  Morphine Sulfate Cr 60 Mg Xr12h-tab (Morphine sulfate) .Marland Kitchen.. 1 tab by mouth two times a day for chronic back pain fill in about 1 month 13)  Morphine Sulfate Cr 60 Mg Xr12h-tab (Morphine sulfate) .Marland Kitchen.. 1 tab by mouth two  times a day for chronic back pain  fill in about 2 months  Other Orders: Influenza Vaccine MCR ([address])  Patient Instructions: 1)  Please schedule a follow-up appointment in 3 months .  Prescriptions: MORPHINE SULFATE CR 60 MG XR12H-TAB (MORPHINE SULFATE) 1 tab by mouth two times a day for chronic back pain  FILL IN ABOUT 2 MONTHS  #60 x 0   Entered and Authorized by:   Cindee Salt MD   Signed by:   Cindee Salt MD on 03/14/2010   Method used:   Print then Give to Patient   RxID:   [id number] MORPHINE SULFATE CR 60 MG XR12H-TAB (MORPHINE SULFATE) 1 tab by mouth two times a day for chronic back pain FILL IN ABOUT 1 MONTH  #60 x 0   Entered and Authorized by:  Cindee Salt MD   Signed by:   Cindee Salt MD on 03/14/2010   Method used:   Print then Give to Patient   RxID:   [id number] MORPHINE SULFATE CR 60 MG XR12H-TAB (MORPHINE SULFATE) 1 tab by mouth two times a day for chronic back pain  #60 x 0   Entered and Authorized by:   Cindee Salt MD   Signed by:   Cindee Salt MD on 03/14/2010   Method used:   Print then Give to Patient   RxID:   [id number]   Current Allergies (reviewed today): No known allergies    Influenza Vaccine    Vaccine Type: Fluvax MCR    Site: left deltoid    Mfr: GlaxoSmithKline    Dose: 0.5 ml    Route: IM    Given by: Mervin Hack CMA (AAMA)    Exp. Date: 11/30/2010    Lot #: [id number]    VIS given: 12/25/09 version given March 14, 2010.  Flu Vaccine Consent Questions    Do you have a history of severe allergic reactions to this vaccine? no    Any prior history of allergic reactions to egg and/or gelatin? no    Do you have a sensitivity to the preservative Thimersol? no    Do you have a past history of Guillan-Barre Syndrome? no    Do you currently have an acute febrile illness? no    Have you ever had a severe reaction to latex? no    Vaccine information given and explained to patient?  yes    Are you currently pregnant? no

## 2010-07-02 NOTE — Procedures (Signed)
Summary: Instruction for procedure/[place]  Instruction for procedure/[place]   Imported By: Sherian Rein 11/09/2009 14:31:42  _____________________________________________________________________  External Attachment:    Type:   Image     Comment:   External Document

## 2010-07-02 NOTE — Progress Notes (Signed)
Summary: Morphine too expensive needs different pain med.  Phone Note Call from Patient Call back at [phone]   Caller: Patient Call For: Cindee Salt MD Summary of Call: Pt went to pick up Morphine at pharmacy and pt has been paying $6.00 per prescription. Now she has to pay $40.00 for each prescription of Morphine and pt said she did not pick up because cannot afford that.  Pt has enough med thru tomorrow 04/19/10. Pt wonders if Dr. Alphonsus Sias can change med to something else less expensive. Pt said we could call her back tomorrow 06/19/09. Initial call taken by: Lewanda Rife LPN,  June 18, 2009 5:35 PM  Follow-up for Phone Call        I don't know that anything would be less money. The morphine is a generic she can check about fentanyl patch 50 micrograms. That would be a suitable alternative. Have her check with the pharmacist about how miuch #10 of those would be Follow-up by: Cindee Salt MD,  June 18, 2009 5:56 PM  Additional Follow-up for Phone Call Additional follow up Details #1::        Left message with patient's spouse to have her return call.  Linde Gillis CMA [facility] Dull)  June 19, 2009 8:12 AM   Patient advised as instructed.  Will check with the pharmacist and call us back. Additional Follow-up by: Linde Gillis CMA [facility] Dull),  June 19, 2009 9:10 AM

## 2010-07-02 NOTE — Letter (Signed)
Summary: Appt Reminder 2  [facility]  [address] [place], [place] [address]   Phone: [phone]  Fax: [phone]        November 16, 2009 MRN: [id number]    [name] [address] [place], [place]  [address]    Dear Ms. DOMBROSKY,   You have a return appointment with Dr.Robert Arlyce Dice on 12-24-09 at 10:30am.  Please remember to bring a complete list of the medicines you are taking, your insurance card and your co-pay.  If you have to cancel or reschedule this appointment, please call before 5:00 pm the evening before to avoid a cancellation fee.  If you have any questions or concerns, please call [phone].    Sincerely,    Laureen Ochs LPN  Appended Document: Appt Reminder 2 Letter mailed to patient.

## 2010-07-02 NOTE — Progress Notes (Signed)
Summary: GES and REV Scheduled  Phone Note Outgoing Call   Call placed by: Laureen Ochs LPN,  November 15, 2009 2:29 PM Call placed to: Patient Summary of Call: Per Dr.Abubakr Wieman/Endo. report from 11-15-09--Pt. is scheduled for a GES at [facility] on 11-29-09 at 9am (NPO after 12mn) and a follow-up office visit on  12-24-09 at 10:30am. I will call the pt. tomorrow to give her appt. information. Initial call taken by: Laureen Ochs LPN,  November 15, 2009 2:31 PM  Follow-up for Phone Call        Above appt. information given to pt. by phone and mailed to her. Pt. instructed to call back as needed.  Follow-up by: Laureen Ochs LPN,  November 16, 2009 10:25 AM

## 2010-07-02 NOTE — Assessment & Plan Note (Signed)
Summary: 3 m f/u dlo   Vital Signs:  Patient profile:   65 year old Vanessa [name] Weight:      158 pounds O2 Sat:      97 % on 2 L/min Temp:     98.4 degrees F oral Pulse rate:   65 / minute Pulse rhythm:   regular BP sitting:   118 / 70  (left arm) Cuff size:   regular  Vitals Entered By: Mervin Hack CMA Duncan Dull) (September 10, 2009 10:41 AM)  O2 Flow:  2 L/min CC: 3 month follow-up   History of Present Illness: Insurance coverage for morphine has changed Just sticking with it though still gets reasonable pain control from back pain with this  Stomach has been "so-so" still has to be careful what she eats---especially greasy food uses hyomax with good success uses the ondansetron occ for nausea---they work well  Breathing is about the same Very limited  tolerance for activity able to stand and cook in [employer] Can't vacuum or sweep Can walk 50 feet with out dyspnea (if flat)  Depression has been controlled no mood issues  Allergies: No Known Drug Allergies  Past History:  Past medical, surgical, family and social histories (including risk factors) reviewed for relevance to current acute and chronic problems.  Past Medical History: Congestive heart failure ?diastolic (with SVT) 5/03 COPD Coronary artery disease Depression GERD Peripheral vascular disease Peptic ulcer disease--2008   + H pylori and classic symptoms (antibiotics given)  Also in 20's Chronic back pain  Past Surgical History: Reviewed history from 04/13/2009 and no changes required. Cholecystectomy 9/02 1997 Aorto-bifem Hysterectomy  MI multiple Multiple angioplasties  Tubal ligation Seizure vs TIA 2003 Echo- EF 77% 6/03  dig toxicity Hospice due to severe CHF 2003-----probably due to actos (or avandia)  Family History: Reviewed history from 04/13/2009 and no changes required. Nieces with cystic fibrosis Soster with pulmonary fibrosis Family History of Breast Cancer: Sister No FH of Colon  Cancer: Family History of Diabetes: Brother Family History of Heart Disease: Mother  Social History: Reviewed history from 04/13/2009 and no changes required. 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   Review of Systems  The patient denies chest pain and syncope.         appetite fair--no change for her weight is stable sleeps well doesn't check sugars---diabetes taken off her problem  list. No longer an issue   Physical Exam  General:  alert and normal appearance.   O2 on  Neck:  supple, no masses, no thyromegaly, no carotid bruits, and no cervical lymphadenopathy.   Lungs:  normal respiratory effort, normal breath sounds, no crackles, and no wheezes.   Heart:  normal rate, regular rhythm, no murmur, and no gallop.   Abdomen:  soft and non-tender.   Msk:  no joint tenderness and no joint swelling.   Extremities:  no edema Psych:  normally interactive, good eye contact, not anxious appearing, and not depressed appearing.     Impression & Recommendations:  Problem # 1:  BACK PAIN, CHRONIC (ICD-724.5) Assessment Unchanged stable on current regimen  Her updated medication list for this problem includes:    Ms Contin 60 Mg Tb12 (Morphine sulfate) .Marland Kitchen... 1 two times a day    Ms Contin 60 Mg Xr12h-tab (Morphine sulfate) .Marland Kitchen... 1 two times a day  fill in about 1 month    Ms Contin 60 Mg Xr12h-tab (  Morphine sulfate) .Marland Kitchen... 1 two times a day  fill in about 2 months    Morphine Sulfate 15 Mg Tabs (Morphine sulfate) .Marland Kitchen... 1 by mouth as needed for chest pain unresponsive to nitroglycerin    Aspirin Low Dose 81 Mg Tabs (Aspirin) .Marland Kitchen... Take one by mouth once a day  Problem # 2:  COPD (ICD-496) Assessment: Unchanged stable status no functional changes  Problem # 3:  DEPRESSION (ICD-311) Assessment: Unchanged stable on meds needs life long Rx  Her updated  medication list for this problem includes:    Paroxetine Hcl 40 Mg Tabs (Paroxetine hcl) .Marland Kitchen... Take one by mouth once a day  Problem # 4:  GERD (ICD-530.81) Assessment: Unchanged controlled on the medication  Her updated medication list for this problem includes:    Omeprazole 20 Mg Cpdr (Omeprazole) .Marland Kitchen... Take one by mouth two times a day  Problem # 5:  CORONARY ARTERY DISEASE (ICD-414.00) Assessment: Unchanged quiet no symptoms  Her updated medication list for this problem includes:    Toprol Xl 50 Mg Tb24 (Metoprolol succinate) .Marland Kitchen... Take one by mouth once a day    Aspirin Low Dose 81 Mg Tabs (Aspirin) .Marland Kitchen... Take one by mouth once a day    Lasix 40 Mg Tabs (Furosemide) .Marland Kitchen... Take one by mouth once a day as needed  Orders: Venipuncture (01093) TLB-Renal Function Panel (80069-RENAL) TLB-CBC Platelet - w/Differential (85025-CBCD) TLB-Hepatic/Liver Function Pnl (80076-HEPATIC) TLB-TSH (Thyroid Stimulating Hormone) (84443-TSH)  Complete Medication List: 1)  Omeprazole 20 Mg Cpdr (Omeprazole) .... Take one by mouth two times a day 2)  Paroxetine Hcl 40 Mg Tabs (Paroxetine hcl) .... Take one by mouth once a day 3)  Toprol Xl 50 Mg Tb24 (Metoprolol succinate) .... Take one by mouth once a day 4)  Ms Contin 60 Mg Tb12 (Morphine sulfate) .Marland Kitchen.. 1 two times a day 5)  Ms Contin 60 Mg Xr12h-tab (Morphine sulfate) .Marland Kitchen.. 1 two times a day  fill in about 1 month 6)  Ms Contin 60 Mg Xr12h-tab (Morphine sulfate) .Marland Kitchen.. 1 two times a day  fill in about 2 months 7)  Morphine Sulfate 15 Mg Tabs (Morphine sulfate) .Marland Kitchen.. 1 by mouth as needed for chest pain unresponsive to nitroglycerin 8)  Aspirin Low Dose 81 Mg Tabs (Aspirin) .... Take one by mouth once a day 9)  Senokot S 8.6-50 Mg Tabs (Sennosides-docusate sodium) .... Take 2 tabs two times a day 10)  Oxygen  11)  Lasix 40 Mg Tabs (Furosemide) .... Take one by mouth once a day as needed 12)  Mineral Oil Oil (Mineral oil) .... Take 1 teaspoon at  bedtime 13)  Nebulizer Misc (Nebulizers) .... As needed 14)  Miralax Powd (Polyethylene glycol 3350) .Marland Kitchen.. 1 capful with water two times a day 15)  Ondansetron 4 Mg Tbdp (Ondansetron) .... Take one by mouth every 6 hours as needed for nausea.  Patient Instructions: 1)  Please schedule a follow-up appointment in 3 months .  Prescriptions: MS CONTIN 60 MG  XR12H-TAB (MORPHINE SULFATE) 1 two times a day  FILL IN ABOUT 2 MONTHS  #60 x 0   Entered and Authorized by:   Cindee Salt MD   Signed by:   Cindee Salt MD on 09/10/2009   Method used:   Print then Give to Patient   RxID:   [id number] MS CONTIN 60 MG  XR12H-TAB (MORPHINE SULFATE) 1 two times a day  FILL IN ABOUT 1 MONTH  #60 x 0   Entered  and Authorized by:   Cindee Salt MD   Signed by:   Cindee Salt MD on 09/10/2009   Method used:   Print then Give to Patient   RxID:   [id number] MS CONTIN 60 MG TB12 (MORPHINE SULFATE) 1 two times a day  #60 x 0   Entered and Authorized by:   Cindee Salt MD   Signed by:   Cindee Salt MD on 09/10/2009   Method used:   Print then Give to Patient   RxID:   [id number]   Current Allergies (reviewed today): No known allergies

## 2010-07-02 NOTE — Letter (Signed)
Summary: EGD Instructions  [facility]  [address] [place], [place] [address]   Phone: [phone]  Fax: [phone]       Vanessa [name]    1945/11/05    MRN: [id number]       Procedure Day /Date: 11-15-09     Arrival Time: 11:30 Am      Procedure Time: 12:30 PM     Location of Procedure:                     [name]   [facility] ( Outpatient Registration)   PREPARATION FOR ENDOSCOPY   On6-16-11  THE DAY OF THE PROCEDURE:Thursday  1.   No solid foods, milk or milk products are allowed after midnight the night before your procedure.  2.   Do not drink anything colored red or purple.  Avoid juices with pulp.  No orange juice.  3.  You may drink clear liquids until 8:30 AM , which is 4 hours before your procedure.                                                                                                CLEAR LIQUIDS INCLUDE: Water Jello Ice Popsicles Tea (sugar ok, no milk/cream) Powdered fruit flavored drinks Coffee (sugar ok, no milk/cream) Gatorade Juice: apple, white grape, white cranberry  Lemonade Clear bullion, consomm, broth Carbonated beverages (any kind) Strained chicken noodle soup Hard Candy   MEDICATION INSTRUCTIONS  Unless otherwise instructed, you should take regular prescription medications with a small sip of water as early as possible the morning of your procedure.        OTHER INSTRUCTIONS  You will need a responsible adult at least 63 years of age to accompany you and drive you home.   This person must remain in the waiting room during your procedure.  Wear loose fitting clothing that is easily removed.  Leave jewelry and other valuables at home.  However, you may wish to bring a book to read or an iPod/MP3 player to listen to music as you wait for your procedure to start.  Remove all body piercing jewelry and leave at home.  Total time from sign-in until discharge is approximately 2-3 hours.  You should go  home directly after your procedure and rest.  You can resume normal activities the day after your procedure.  The day of your procedure you should not:   Drive   Make legal decisions   Operate machinery   Drink alcohol   Return to work  You will receive specific instructions about eating, activities and medications before you leave.    The above instructions have been reviewed and explained to me by   _______________________    I fully understand and can verbalize these instructions _____________________________ Date _________     Appended Document: EGD Instructions LM on pt's home number on ans machine for her to arrive at [facility] on 11-15-09 at 11AM. Due to the sedation Dr. Arlyce Dice wants to use, she is to have nothing to eat or drink after midnight. I asked her to  please call me if she has any questions.

## 2010-07-02 NOTE — Letter (Signed)
Summary: Results Letter  [facility]  [address] [place], [place] [address]   Phone: [phone]  Fax: [phone]        November 22, 2009 MRN: [id number]    Vanessa [name] [address] [place], [place]  [address]    Dear [name],  Your biopsy results did not show any remarkable findings.  Please continue with the recommendations previously discussed.  Should you have any further questions or immediate concers, feel free to contact me.  Sincerely,  Barbette Hair. Arlyce Dice, M.D., [facility]          Sincerely,  Louis Meckel MD  This letter has been electronically signed by your physician.  Appended Document: Results Letter Letter mailed to patient.

## 2010-07-02 NOTE — Assessment & Plan Note (Signed)
Summary: FOLLOW UP / LFW   Vital Signs:  Patient profile:   65 year old female Weight:      156 pounds O2 Sat:      97 % on 2 L/min Temp:     97.4 degrees F oral Pulse rate:   66 / minute Pulse rhythm:   regular BP sitting:   110 / 68  (left arm) Cuff size:   regular  Vitals Entered By: Mervin Hack CMA Duncan Dull) (December 10, 2009 2:23 PM)  O2 Flow:  2 L/min CC: 3 month follow-up   History of Present Illness: Stomach is much better has really calmed down since going on the reglan Morphine sulfate contin was taken off her list by GI but she is still on it This continues to help the back pain still gets breakthrough at times  Breathing is about the same hasn't needed the quick acting morphine for her breathing  No chest pain  No palpitations no edema  Depression controlled  mood better since stomach improved on reglan  Allergies: No Known Drug Allergies  Past History:  Past medical, surgical, family and social histories (including risk factors) reviewed for relevance to current acute and chronic problems.  Past Medical History: Reviewed history from 11/08/2009 and no changes required. Congestive heart failure ?diastolic (with SVT) 5/03 COPD Coronary artery disease Depression GERD Peripheral vascular disease Peptic ulcer disease--2008   + H pylori and classic symptoms (antibiotics given)  Also in 20's Chronic back pain Chronic constipation  Past Surgical History: Reviewed history from 04/13/2009 and no changes required. Cholecystectomy 9/02 1997 Aorto-bifem Hysterectomy  MI multiple Multiple angioplasties  Tubal ligation Seizure vs TIA 2003 Echo- EF 77% 6/03  dig toxicity Hospice due to severe CHF 2003-----probably due to actos (or avandia)  Family History: Reviewed history from 04/13/2009 and no changes required. Nieces with cystic fibrosis Soster with pulmonary fibrosis Family History of Breast Cancer: Sister No FH of Colon Cancer: Family  History of Diabetes: Brother Family History of Heart Disease: Mother  Social History: Reviewed history from 04/13/2009 and no changes required. 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   Review of Systems       Appetite is much better now can eat without vomiting weight stable sleeps okay  Physical Exam  General:  alert and normal appearance.   Neck:  supple, no masses, no thyromegaly, no carotid bruits, and no cervical lymphadenopathy.   Lungs:  normal respiratory effort, no intercostal retractions, and no accessory muscle use.  Slightly decreased breath sounds but clear Heart:  normal rate, regular rhythm, no murmur, and no gallop.   Abdomen:  soft and non-tender.   Extremities:  no edema Psych:  normally interactive, good eye contact, not anxious appearing, and not depressed appearing.     Impression & Recommendations:  Problem # 1:  CHRONIC SYSTOLIC HEART FAILURE (ICD-428.22) Assessment Unchanged stable status no changes needed  Her updated medication list for this problem includes:    Lasix 40 Mg Tabs (Furosemide) .Marland Kitchen... Take one by mouth once a day as needed    Aspirin Low Dose 81 Mg Tabs (Aspirin) .Marland Kitchen... Take one by mouth once a day  Problem # 2:  BACK PAIN, CHRONIC (ICD-724.5) Assessment: Unchanged continues on the long acting morphine Not sure why it was taken off her list at GI office discussed that morphine can add to gastroparesis but she has needed  this for many years---even if a problem, would have to be very slowly weaned off  Her updated medication list for this problem includes:    Morphine Sulfate 15 Mg Tabs (Morphine sulfate) ..... One tablet by mouth ever morining and at bedtime    Aspirin Low Dose 81 Mg Tabs (Aspirin) .Marland Kitchen... Take one by mouth once a day    Morphine Sulfate Cr 60 Mg Xr12h-tab (Morphine sulfate) .Marland Kitchen... 1 tab by mouth two  times a day for chronic back pain    Morphine Sulfate Cr 60 Mg Xr12h-tab (Morphine sulfate) .Marland Kitchen... 1 tab by mouth two times a day for chronic back pain fill in about 1 month    Morphine Sulfate Cr 60 Mg Xr12h-tab (Morphine sulfate) .Marland Kitchen... 1 tab by mouth two times a day for chronic back pain  fill in about 2 months  Problem # 3:  CORONARY ARTERY DISEASE (ICD-414.00) Assessment: Unchanged has been quiet  Her updated medication list for this problem includes:    Lasix 40 Mg Tabs (Furosemide) .Marland Kitchen... Take one by mouth once a day as needed    Aspirin Low Dose 81 Mg Tabs (Aspirin) .Marland Kitchen... Take one by mouth once a day  Problem # 4:  COPD (ICD-496) Assessment: Unchanged stable on oxygen and since not smoking  Problem # 5:  NAUSEA (ICD-787.02) Assessment: Improved much better since on reglan  The following medications were removed from the medication list:    Promethazine Hcl 25 Mg Tabs (Promethazine hcl) .Marland Kitchen... 1/2-1 tab by mouth three times a day as needed for nausea  Complete Medication List: 1)  Reglan 10 Mg Tabs (Metoclopramide hcl) .... Take 1 by mouth 30 minutes before breakfast, lunch, dinner and at bedtime. 2)  Omeprazole 20 Mg Cpdr (Omeprazole) .... Take one by mouth two times a day 3)  Paroxetine Hcl 40 Mg Tabs (Paroxetine hcl) .... Take one by mouth once a day 4)  Morphine Sulfate 15 Mg Tabs (Morphine sulfate) .... One tablet by mouth ever morining and at bedtime 5)  Lasix 40 Mg Tabs (Furosemide) .... Take one by mouth once a day as needed 6)  Mineral Oil Oil (Mineral oil) .... Take 1 teaspoon at bedtime 7)  Nebulizer Misc (Nebulizers) .... As needed 8)  Miralax Powd (Polyethylene glycol 3350) .Marland Kitchen.. 1 capful with water two times a day 9)  Aspirin Low Dose 81 Mg Tabs (Aspirin) .... Take one by mouth once a day 10)  Senokot S 8.6-50 Mg Tabs (Sennosides-docusate sodium) .... Take 2 tabs two times a day 11)  Morphine Sulfate Cr 60 Mg Xr12h-tab (Morphine sulfate) .Marland Kitchen.. 1 tab by mouth two times  a day for chronic back pain 12)  Morphine Sulfate Cr 60 Mg Xr12h-tab (Morphine sulfate) .Marland Kitchen.. 1 tab by mouth two times a day for chronic back pain fill in about 1 month 13)  Morphine Sulfate Cr 60 Mg Xr12h-tab (Morphine sulfate) .Marland Kitchen.. 1 tab by mouth two times a day for chronic back pain  fill in about 2 months  Patient Instructions: 1)  Please schedule a follow-up appointment in 3 months .  Prescriptions: MORPHINE SULFATE CR 60 MG XR12H-TAB (MORPHINE SULFATE) 1 tab by mouth two times a day for chronic back pain  FILL IN ABOUT 2 MONTHS  #60 x 0   Entered and Authorized by:   Cindee Salt MD   Signed by:   Cindee Salt MD on 12/10/2009   Method used:   Print then Give to Patient   RxID:  [id number] MORPHINE SULFATE CR 60 MG XR12H-TAB (MORPHINE SULFATE) 1 tab by mouth two times a day for chronic back pain FILL IN ABOUT 1 MONTH  #60 x 0   Entered and Authorized by:   Cindee Salt MD   Signed by:   Cindee Salt MD on 12/10/2009   Method used:   Print then Give to Patient   RxID:   [id number] MORPHINE SULFATE CR 60 MG XR12H-TAB (MORPHINE SULFATE) 1 tab by mouth two times a day for chronic back pain  #60 x 0   Entered and Authorized by:   Cindee Salt MD   Signed by:   Cindee Salt MD on 12/10/2009   Method used:   Print then Give to Patient   RxID:   [id number]   Current Allergies (reviewed today): No known allergies

## 2010-07-02 NOTE — Assessment & Plan Note (Signed)
Summary: WORSENING NAUSEA, BURPING AND HEARTBURN       (DR.KAPLAN PT.).Marland KitchenMarland Kitchen   History of Present Illness Primary GI MD: Melvia Heaps MD [facility] Primary Provider: Dr Tillman Abide Chief Complaint: Increae in N/V, belching, bloating over the last several months. Denies any Abd pain.  History of Present Illness:   [name] is a 65 year old female with multiple medical problems including oxygen dependent COPD, PVD, and CAD. She is followed by Dr. Arlyce Dice for history of diverticulosis and upper abdominal pain.   Patient is here for a several month history of nausea. Called here in Dec. 2010 with nausea, an acute abdominal series was negative.  Zofran initially worked but no longer effective despite doubling dose to two every four hours. Gives history of weight loss. Weight 158 in April, 156 today.  Nausea lasts 3-4 days, subsides for couple of days then recurs. She does vomit sometimes, usually yellowish emesis. She has associated bloating and belching.   Patient has chronic constipation despite Senekot, Miralax twice daily and Mineral oil. BMs are still scant.    GI Review of Systems    Reports belching, bloating, heartburn, nausea, and  vomiting.      Denies abdominal pain, acid reflux, dysphagia with liquids, dysphagia with solids, loss of appetite, vomiting blood, weight loss, and  weight gain.      Reports constipation.     Denies anal fissure, black tarry stools, change in bowel habit, diarrhea, diverticulosis, fecal incontinence, heme positive stool, hemorrhoids, irritable bowel syndrome, jaundice, light color stool, liver problems, rectal bleeding, and  rectal pain.    Current Medications (verified): 1)  Omeprazole 20 Mg Cpdr (Omeprazole) .... Take One By Mouth Two Times A Day 2)  Paroxetine Hcl 40 Mg Tabs (Paroxetine Hcl) .... Take One By Mouth Once A Day 3)  Morphine Sulfate 15 Mg Tabs (Morphine Sulfate) .... One Tablet By Mouth Ever Morining and At Bedtime 4)  Aspirin Low Dose 81 Mg   Tabs (Aspirin) .... Take One By Mouth Once A Day 5)  Senokot S 8.6-50 Mg  Tabs (Sennosides-Docusate Sodium) .... Take 2 Tabs Two Times A Day 6)  Oxygen 7)  Lasix 40 Mg  Tabs (Furosemide) .... Take One By Mouth Once A Day As Needed 8)  Mineral Oil  Oil (Mineral Oil) .... Take 1 Teaspoon At Bedtime 9)  Nebulizer  Misc (Nebulizers) .... As Needed 10)  Miralax  Powd (Polyethylene Glycol 3350) .Marland Kitchen.. 1 Capful With Water Two Times A Day  Allergies (verified): No Known Drug Allergies  Past History:  Past Medical History: Congestive heart failure ?diastolic (with SVT) 5/03 COPD Coronary artery disease Depression GERD Peripheral vascular disease Peptic ulcer disease--2008   + H pylori and classic symptoms (antibiotics given)  Also in 20's Chronic back pain Chronic constipation  Past Surgical History: Reviewed history from 04/13/2009 and no changes required. Cholecystectomy 9/02 1997 Aorto-bifem Hysterectomy  MI multiple Multiple angioplasties  Tubal ligation Seizure vs TIA 2003 Echo- EF 77% 6/03  dig toxicity Hospice due to severe CHF 2003-----probably due to actos (or avandia)  Family History: Reviewed history from 04/13/2009 and no changes required. Nieces with cystic fibrosis Soster with pulmonary fibrosis Family History of Breast Cancer: Sister No FH of Colon Cancer: Family History of Diabetes: Brother Family History of Heart Disease: Mother  Social History: Reviewed history from 04/13/2009 and no changes required. Retired--textile worker Married--2 children Former Smoker-stopped 3.5 years ago Alcohol use-no Illicit Drug Use - no Patient does not get regular  exercise.   Review of Systems  The patient denies allergy/sinus, anemia, anxiety-new, arthritis/joint pain, back pain, blood in urine, breast changes/lumps, change in vision, confusion, cough, coughing up blood, depression-new, fainting, fatigue, fever, headaches-new, hearing problems, heart murmur, heart rhythm  changes, itching, menstrual pain, muscle pains/cramps, night sweats, nosebleeds, pregnancy symptoms, shortness of breath, skin rash, sleeping problems, sore throat, swelling of feet/legs, swollen lymph glands, thirst - excessive , urination - excessive , urination changes/pain, urine leakage, vision changes, and voice change.    Vital Signs:  Patient profile:   65 year old female Height:      66 inches Weight:      156 pounds BMI:     25.27 Pulse rate:   60 / minute Pulse rhythm:   regular BP sitting:   120 / 74  (left arm) Cuff size:   regular  Physical Exam  General:  Well developed, well nourished, no acute distress. Head:  Normocephalic and atraumatic. Eyes:  Conjunctiva pink, no icterus.  Mouth:  No oral lesions. Tongue moist.  Neck:  no obvious masses  Lungs:  Clear throughout to auscultation. Heart:  RRR Abdomen:  Abdomen soft, nontender, nondistended. No obvious masses or hepatomegaly.Normal bowel sounds.  Msk:  Symmetrical with no gross deformities. Normal posture. Extremities:  No palmar erythema, no edema.  Neurologic:  Alert and  oriented x4;  grossly normal neurologically. Skin:  Intact without significant lesions or rashes. Cervical Nodes:  No significant cervical adenopathy. Psych:  Alert and cooperative. Normal mood and affect.   Impression & Recommendations:  Problem # 1:  NAUSEA AND VOMITING (ICD-787.01) Assessment Deteriorated Several month history of nausea, bloating and belching with intermittent vomiting. On twice daily Morphine for back pain but has taken for 8 years. Remote cholecystectomy. Only two pound weight loss since April (by our records) but she is clearly tired of feeling nauseated. Zofran no longer effective. For further evaluation the patient will be scheduled for an EGD with biopsies ( if indicated).  The risks and benefits of the procedure, as well as alternatives were discussed with the patient and she agrees to proceed. Given COPD /   oxygen dependency  and chronic narcotic use, will schedule at [facility] with Propofol. Not sure that gastric empyting study would add much here as it would likely be abnormal on Morphine and I doubt she will stopping that medication anytime soon.  Problem # 2:  COPD (ICD-496) Assessment: Comment Only  Problem # 3:  PERIPHERAL VASCULAR DISEASE (ICD-443.9)  Problem # 4:  CONSTIPATION (ICD-564.00) Assessment: Unchanged On several agents for constipation. Narcotics likely contributing. After patient left today I looked for date and findings of last colonoscopy. I see colonoscopy was recommended in November but cannot locate results. Will confirm if she had colonoscopy but if not then we may can do it at same time as EGD since Propofol will be needed.   Other Orders: ZEGD (ZEGD)  Patient Instructions: 1)  We schedeuld the Endoscopy with Dr. Arlyce Dice on 11-15-09. 2)  Dr. Arlyce Dice chose to do the procedure at [facility] on the 1syt floor. 3)  Directions and brochure given. 4)  Copy sent to : Dr. Tillman Abide 5)  The medication list was reviewed and reconciled.  All changed / newly prescribed medications were explained.  A complete medication list was provided to the patient / caregiver.

## 2010-07-02 NOTE — Procedures (Signed)
Summary: Upper Endoscopy  Patient: Vanessa [name] Note: All result statuses are Final unless otherwise noted.  Tests: (1) Upper Endoscopy (EGD)   EGD Upper Endoscopy       DONE     [facility]     [address] [place], [place]  [address]           ENDOSCOPY PROCEDURE REPORT           PATIENT:  Vanessa [name]  MR#:  [id number]     BIRTHDATE:  20-Jun-1945, 63 yrs. old  GENDER:  female           ENDOSCOPIST:  Barbette Hair. Arlyce Dice, MD     Referred by:           PROCEDURE DATE:  11/15/2009     PROCEDURE:  EGD with biopsy     ASA CLASS:  Class II     INDICATIONS:  nausea           MEDICATIONS:   MAC sedation, administered by CRNA     TOPICAL ANESTHETIC:           DESCRIPTION OF PROCEDURE:   After the risks benefits and     alternatives of the procedure were thoroughly explained, informed     consent was obtained.  The Pentax Gastroscope [id number] endoscope     was introduced through the mouth and advanced to the third portion     of the duodenum, without limitations.  The instrument was slowly     withdrawn as the mucosa was fully examined.     <<PROCEDUREIMAGES>>           Mild gastritis was found in the antrum. Mild nonerosive gastritis     with streaky erythema. Bxs taken (see image1 and image2).     Otherwise the examination was normal.    Retroflexed views revealed     no abnormalities.    The scope was then withdrawn from the patient     and the procedure completed.           COMPLICATIONS:  None           ENDOSCOPIC IMPRESSION:     1) Mild gastritis in the antrum     2) Otherwise normal examination     RECOMMENDATIONS:     1) Await biopsy results     2) continue current medications     3) My office will arrange for you to have a Gastric Emptying     Scan performed. This is a radiology test that gives an idea of how     well your stomach functions.     4) Call office next 2-3 days to schedule an office appointment     for 2-3 weeks           REPEAT  EXAM:  No           ______________________________     Barbette Hair. Arlyce Dice, MD           CC:  Karie Schwalbe, MD           n.     Rosalie Doctor:   Barbette Hair. Kaplan at 11/15/2009 12:47 PM           Isabella Stalling, [id number]  Note: An exclamation mark (!) indicates a result that was not dispersed into the flowsheet. Document Creation Date: 11/15/2009 12:48 PM _______________________________________________________________________  (1) Order result status: Final Collection or observation date-time: 11/15/2009 12:44  Requested date-time:  Receipt date-time:  Reported date-time:  Referring Physician:   Ordering Physician: Melvia Heaps [phone]) Specimen Source:  Source: Launa Grill Order Number: [id number] Lab site:

## 2010-07-02 NOTE — Progress Notes (Signed)
Summary: Worsening Nausea  Phone Note Call from Patient Call back at Home Phone [phone]   Call For: DR [facility] Summary of Call: Used up all the nausea medicine she was given whe she had her Endo. Is getting alot of nausea and wonders if she can get more medicine or be seen as soon as possible. Initial call taken by: Leanor Kail [facility],  November 08, 2009 8:36 AM  Follow-up for Phone Call        Pt. c/o worsening episodes of  daily nausea. Also c/o heartburn, increased burping, constipation. Denies fever and blood.  Wants to be seen ASAP. She will see Willette Cluster NP today at 11am. Follow-up by: Laureen Ochs LPN,  November 08, 4740 9:09 AM

## 2010-07-04 NOTE — Assessment & Plan Note (Signed)
Summary: ROA FOR 3 MONTH FOLLOW-UP/JRR   Vital Signs:  Patient profile:   65 year old female Weight:      141 pounds Temp:     97.9 degrees F oral Pulse rate:   78 / minute Pulse rhythm:   regular BP sitting:   124 / 80  (left arm) Cuff size:   regular  Vitals Entered By: Mervin Hack CMA Duncan Dull) (June 14, 2010 12:12 PM) CC: 3 month follow-up   History of Present Illness: Doing okay  Has noted some shaking in hands Making it hard to eat--has to use spoon No trouble walking writing has always been bad Lots of tremor in family  Arthritis pain fine on morphine dosage  No chest pain Breathing has been okay--some trouble lately Gets regular racing heart No edema  bad cold for a few weeks seems to be clearing up mostly a cough  Depression controlled on meds  stomach has been fine since on the metoclopramide  Allergies: No Known Drug Allergies  Past History:  Past medical, surgical, family and social histories (including risk factors) reviewed for relevance to current acute and chronic problems.  Past Medical History: Congestive heart failure ?diastolic (with SVT) 5/03 COPD Coronary artery disease Depression GERD Peripheral vascular disease Peptic ulcer disease--2008   + H pylori and classic symptoms (antibiotics given)  Also in 20's Chronic back pain Chronic constipation Tremor  Past Surgical History: Reviewed history from 04/13/2009 and no changes required. Cholecystectomy 9/02 1997 Aorto-bifem Hysterectomy  MI multiple Multiple angioplasties  Tubal ligation Seizure vs TIA 2003 Echo- EF 77% 6/03  dig toxicity Hospice due to severe CHF 2003-----probably due to actos (or avandia)  Family History: Reviewed history from 04/13/2009 and no changes required. Nieces with cystic fibrosis Soster with pulmonary fibrosis Family History of Breast Cancer: Sister No FH of Colon Cancer: Family History of Diabetes: Brother Family History of Heart  Disease: Mother  Social History: Reviewed history from 04/13/2009 and no changes required. 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   Review of Systems       weight down 9#---she relates it to recent cold eating about the same sleeps okay  Physical Exam  General:  alert and normal appearance.   Neck:  supple, no masses, no thyromegaly, and no cervical lymphadenopathy.   Lungs:  normal respiratory effort, no intercostal retractions, and no accessory muscle use.  Decreased breath sounds but clear Heart:  normal rate, regular rhythm, no murmur, and no gallop.   Extremities:  no edema Neurologic:  strength normal in all extremities and gait normal.   resting tremor about the same with intention--left hand worse then right. No sig bradykinesia. Tone is normal. Handwriting is shaky but stays the same size Psych:  normally interactive, good eye contact, not anxious appearing, and not depressed appearing.     Impression & Recommendations:  Problem # 1:  TREMOR, ESSENTIAL (ICD-333.1) Assessment Deteriorated will try increasing the metoprolol if worsens, will consider other meds  Problem # 2:  COPD (ICD-496) Assessment: Unchanged stable status would start antibiotic if cold doesn't resolve  Problem # 3:  BACK PAIN, CHRONIC (ICD-724.5) Assessment: Unchanged okay with the meds  Her updated medication list for this problem includes:    Morphine Sulfate 15 Mg Tabs (Morphine sulfate) ..... One tablet by mouth ever morining and at bedtime    Aspirin Low Dose 81 Mg Tabs (Aspirin) .Marland KitchenMarland KitchenMarland KitchenMarland Kitchen  Take one by mouth once a day    Morphine Sulfate Cr 60 Mg Xr12h-tab (Morphine sulfate) .Marland Kitchen... 1 tab by mouth two times a day for chronic back pain    Morphine Sulfate Cr 60 Mg Xr12h-tab (Morphine sulfate) .Marland Kitchen... 1 tab by mouth two times a day for chronic back pain fill in about 1 month     Morphine Sulfate Cr 60 Mg Xr12h-tab (Morphine sulfate) .Marland Kitchen... 1 tab by mouth two times a day for chronic back pain  fill in about 2 months  Problem # 4:  CHRONIC SYSTOLIC HEART FAILURE (ICD-428.22) Assessment: Unchanged compensated  no changes needed but increasing metoprolol to see if it helps tremor  The following medications were removed from the medication list:    Metoprolol Succinate 50 Mg Xr24h-tab (Metoprolol succinate) .Marland Kitchen... 1 tab daily Her updated medication list for this problem includes:    Lasix 40 Mg Tabs (Furosemide) .Marland Kitchen... Take one by mouth once a day as needed    Aspirin Low Dose 81 Mg Tabs (Aspirin) .Marland Kitchen... Take one by mouth once a day    Metoprolol Succinate 100 Mg Xr24h-tab (Metoprolol succinate) .Marland Kitchen... 1 tab daily  Problem # 5:  DEPRESSION (ICD-311) Assessment: Unchanged mood fine on meds  Her updated medication list for this problem includes:    Paroxetine Hcl 40 Mg Tabs (Paroxetine hcl) .Marland Kitchen... Take one by mouth once a day  Complete Medication List: 1)  Reglan 10 Mg Tabs (Metoclopramide hcl) .... Take 1 by mouth 30 minutes before breakfast, lunch, dinner and at bedtime. 2)  Omeprazole 20 Mg Cpdr (Omeprazole) .... Take one by mouth two times a day 3)  Paroxetine Hcl 40 Mg Tabs (Paroxetine hcl) .... Take one by mouth once a day 4)  Morphine Sulfate 15 Mg Tabs (Morphine sulfate) .... One tablet by mouth ever morining and at bedtime 5)  Lasix 40 Mg Tabs (Furosemide) .... Take one by mouth once a day as needed 6)  Mineral Oil Oil (Mineral oil) .... Take 1 teaspoon at bedtime 7)  Nebulizer Misc (Nebulizers) .... As needed 8)  Miralax Powd (Polyethylene glycol 3350) .Marland Kitchen.. 1 capful with water two times a day 9)  Aspirin Low Dose 81 Mg Tabs (Aspirin) .... Take one by mouth once a day 10)  Senokot S 8.6-50 Mg Tabs (Sennosides-docusate sodium) .... Take 2 tabs two times a day 11)  Morphine Sulfate Cr 60 Mg Xr12h-tab (Morphine sulfate) .Marland Kitchen.. 1 tab by mouth two times a day for  chronic back pain 12)  Morphine Sulfate Cr 60 Mg Xr12h-tab (Morphine sulfate) .Marland Kitchen.. 1 tab by mouth two times a day for chronic back pain fill in about 1 month 13)  Morphine Sulfate Cr 60 Mg Xr12h-tab (Morphine sulfate) .Marland Kitchen.. 1 tab by mouth two times a day for chronic back pain  fill in about 2 months 14)  Metoprolol Succinate 100 Mg Xr24h-tab (Metoprolol succinate) .Marland Kitchen.. 1 tab daily  Patient Instructions: 1)  Please schedule a follow-up appointment in 3 months .  Prescriptions: METOPROLOL SUCCINATE 100 MG XR24H-TAB (METOPROLOL SUCCINATE) 1 tab daily  #90 x 3   Entered and Authorized by:   Cindee Salt MD   Signed by:   Cindee Salt MD on 06/14/2010   Method used:   Print then Give to Patient   RxID:   [id number] MORPHINE SULFATE CR 60 MG XR12H-TAB (MORPHINE SULFATE) 1 tab by mouth two times a day for chronic back pain  FILL IN ABOUT 2 MONTHS  #60 x 0  Entered and Authorized by:   Cindee Salt MD   Signed by:   Cindee Salt MD on 06/14/2010   Method used:   Print then Give to Patient   RxID:   [id number] MORPHINE SULFATE CR 60 MG XR12H-TAB (MORPHINE SULFATE) 1 tab by mouth two times a day for chronic back pain FILL IN ABOUT 1 MONTH  #60 x 0   Entered and Authorized by:   Cindee Salt MD   Signed by:   Cindee Salt MD on 06/14/2010   Method used:   Print then Give to Patient   RxID:   [id number] MORPHINE SULFATE CR 60 MG XR12H-TAB (MORPHINE SULFATE) 1 tab by mouth two times a day for chronic back pain  #60 x 0   Entered and Authorized by:   Cindee Salt MD   Signed by:   Cindee Salt MD on 06/14/2010   Method used:   Print then Give to Patient   RxID:   [id number] METOPROLOL SUCCINATE 50 MG XR24H-TAB (METOPROLOL SUCCINATE) 1 tab daily  #90 x 3   Entered and Authorized by:   Cindee Salt MD   Signed by:   Cindee Salt MD on 06/14/2010   Method used:   Print then Give to Patient   RxID:    [id number] PAROXETINE HCL 40 MG TABS (PAROXETINE HCL) Take one by mouth once a day  #90 x 3   Entered by:   Mervin Hack CMA (AAMA)   Authorized by:   Cindee Salt MD   Signed by:   Mervin Hack CMA (AAMA) on 06/14/2010   Method used:   Print then Give to Patient   RxID:   [id number] OMEPRAZOLE 20 MG CPDR (OMEPRAZOLE) Take one by mouth two times a day  #180 x 3   Entered by:   Mervin Hack CMA (AAMA)   Authorized by:   Cindee Salt MD   Signed by:   Mervin Hack CMA (AAMA) on 06/14/2010   Method used:   Print then Give to Patient   RxID:   [id number] REGLAN 10 MG TABS (METOCLOPRAMIDE HCL) take 1 by mouth 30 minutes before breakfast, lunch, dinner and at bedtime.  #120 x 3   Entered by:   Mervin Hack CMA (AAMA)   Authorized by:   Cindee Salt MD   Signed by:   Mervin Hack CMA (AAMA) on 06/14/2010   Method used:   Print then Give to Patient   RxID:   [id number]    Orders Added: 1)  Est. Patient Level IV [70623]    Current Allergies (reviewed today): No known allergies

## 2010-07-08 ENCOUNTER — Encounter: Payer: Self-pay | Admitting: Internal Medicine

## 2010-07-17 ENCOUNTER — Encounter: Payer: Self-pay | Admitting: Internal Medicine

## 2010-07-24 NOTE — Letter (Signed)
Summary: [employer] [place]  [employer] [place]   Imported By: Kassie Mends 07/19/2010 11:10:55  _____________________________________________________________________  External Attachment:    Type:   Image     Comment:   External Document

## 2010-08-28 ENCOUNTER — Ambulatory Visit (INDEPENDENT_AMBULATORY_CARE_PROVIDER_SITE_OTHER): Payer: Self-pay | Admitting: Internal Medicine

## 2010-08-28 ENCOUNTER — Encounter: Payer: Self-pay | Admitting: Internal Medicine

## 2010-08-28 VITALS — BP 116/78 | HR 73 | Temp 98.5°F | Ht 66.0 in | Wt 148.0 lb

## 2010-08-28 DIAGNOSIS — B029 Zoster without complications: Secondary | ICD-10-CM

## 2010-08-28 MED ORDER — VALACYCLOVIR HCL 1 G PO TABS: 1000.0000 mg | ORAL_TABLET | Freq: Three times a day (TID) | ORAL | Status: DC

## 2010-08-28 NOTE — Progress Notes (Signed)
  Subjective:    Patient ID: Vanessa [name], female    DOB: [birth year], 65 y.o.   MRN: [id number]  HPI Having increased back pain---in her ribs Started 2 days ago Has been breaking out on abdomen--with rash Pain is around the back in the same area Only on the right side  Some increased cough Light colored green mucus--no change from usual SOme increased SOB with the weather--no acute infection  Past Medical History  Diagnosis Date  . Congestive heart failure 05/03    ? diastolic (withSVT)  . COPD (chronic obstructive pulmonary disease)   . Coronary artery disease   . Depression   . GERD (gastroesophageal reflux disease)   . PVD (peripheral vascular disease)   . Peptic ulcer disease 2008    + H pylori and classic symptoms, antibiotics given, also in 20's  . Chronic back pain   . Chronic constipation   . Tremor   . MI (myocardial infarction)     multiple  . Seizure 2003    vs TIA   . 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 Hospice care     due to severe CHF 2003-probably due to Actos or Avandia    Past Surgical History  Procedure Date  . Cholecystectomy 09/02  . Aorto-femoral bypass graft 1997  . Angioplasty     Multiple  . Tubal ligation   . US echocardiography     EF 77%    Family History  Problem Relation Age of Onset  . Heart disease Mother   . Pulmonary fibrosis Sister   . Cancer Sister     breast  . Diabetes Brother     History   Social History  . Marital Status: Married    Spouse Name: N/A    Number of Children: 2  . Years of Education: N/A   Occupational History  . Retired     Forensic psychologist   Social History Main Topics  . Smoking status: Former Games developer  . Smokeless tobacco: Not on file  . Alcohol Use: No  . Drug Use: No  . Sexually Active: Not on file   Other Topics Concern  . Not on file   Social History Narrative   Patient does not get regular exercise      Review of Systems No stomach trouble No vomiting or diarrhea Eating okay    Objective:   Physical Exam  Constitutional: She appears well-developed and well-nourished. No distress.  Pulmonary/Chest: Effort normal. No respiratory distress. She has no wheezes. She has no rales.       Some rhonchi during expiration but not tight  Skin: Rash noted.       Classic linear vesicular rash along abdomen ~T12 or L1 None on back Slight tenderness to skin over area          Assessment & Plan:

## 2010-08-28 NOTE — Patient Instructions (Addendum)
Keep your regular follow up next month

## 2010-09-12 ENCOUNTER — Ambulatory Visit (INDEPENDENT_AMBULATORY_CARE_PROVIDER_SITE_OTHER): Payer: Self-pay | Admitting: Internal Medicine

## 2010-09-12 ENCOUNTER — Encounter: Payer: Self-pay | Admitting: Internal Medicine

## 2010-09-12 VITALS — BP 110/60 | HR 66 | Temp 98.6°F | Ht 66.0 in | Wt 147.0 lb

## 2010-09-12 DIAGNOSIS — G252 Other specified forms of tremor: Secondary | ICD-10-CM

## 2010-09-12 DIAGNOSIS — F329 Major depressive disorder, single episode, unspecified: Secondary | ICD-10-CM

## 2010-09-12 DIAGNOSIS — J449 Chronic obstructive pulmonary disease, unspecified: Secondary | ICD-10-CM

## 2010-09-12 DIAGNOSIS — M549 Dorsalgia, unspecified: Secondary | ICD-10-CM

## 2010-09-12 DIAGNOSIS — I5022 Chronic systolic (congestive) heart failure: Secondary | ICD-10-CM

## 2010-09-12 DIAGNOSIS — I2510 Atherosclerotic heart disease of native coronary artery without angina pectoris: Secondary | ICD-10-CM

## 2010-09-12 MED ORDER — MORPHINE SULFATE CR 60 MG PO TB12: 60.0000 mg | ORAL_TABLET | Freq: Two times a day (BID) | ORAL | Status: DC

## 2010-09-12 MED ORDER — MORPHINE SULFATE CR 60 MG PO TB12: 60.0000 mg | ORAL_TABLET | Freq: Two times a day (BID) | ORAL | Status: AC

## 2010-09-12 NOTE — Progress Notes (Signed)
Subjective:    Patient ID: Vanessa [name], female    DOB: [birth year], 65 y.o.   MRN: [id number]  HPI DOing okay No new concerns  Has some problems with the pollen Now needs oxygen pretty much all the time Uses loratadine which helps Some cough--no infection  No chest pain No use of NTG in a long time Occ racing heart--usually at rest Occ mild edema---at the end of the day. Better in AM  Stomach fine No heartburn problems on the med  Mood has been fine Not depressed  Back pain still reasonably controlled on the morphine  Current outpatient prescriptions:aspirin 81 MG tablet, Take 81 mg by mouth daily.  , Disp: , Rfl: ;  furosemide (LASIX) 40 MG tablet, Take 40 mg by mouth daily as needed.  , Disp: , Rfl: ;  metoclopramide (REGLAN) 10 MG tablet, Take one by mouth 30 minutes before each meal and at bedtime , Disp: , Rfl: ;  metoprolol (TOPROL-XL) 100 MG 24 hr tablet, Take 100 mg by mouth daily.  , Disp: , Rfl:  mineral oil liquid, Take one teaspoon at bedtime , Disp: , Rfl: ;  morphine (MS CONTIN) 60 MG 12 hr tablet, Take 60 mg by mouth 2 (two) times daily. For chronic back pain , Disp: , Rfl: ;  morphine (MSIR) 15 MG tablet, Take one tablet by mouth every morning and at bedtime , Disp: , Rfl: ;  omeprazole (PRILOSEC) 20 MG capsule, Take 20 mg by mouth 2 (two) times daily.  , Disp: , Rfl:  PARoxetine (PAXIL) 40 MG tablet, Take 40 mg by mouth daily.  , Disp: , Rfl: ;  polyethylene glycol (MIRALAX) powder, Take one capful with water two times a day , Disp: , Rfl: ;  Respiratory Therapy Supplies (NEBULIZER) DEVI, Use as needed , Disp: , Rfl: ;  senna-docusate (SENOKOT S) 8.6-50 MG per tablet, Take 2 tablets by mouth 2 (two) times daily.  , Disp: , Rfl:  DISCONTD: valACYclovir (VALTREX) 1000 MG tablet, Take 1 tablet (1,000 mg total) by mouth 3 (three) times daily., Disp: 21 tablet, Rfl: 1   Past Medical History  Diagnosis Date  . Congestive heart failure 05/03    ? diastolic (withSVT)    . COPD (chronic obstructive pulmonary disease)   . Coronary artery disease   . Depression   . GERD (gastroesophageal reflux disease)   . PVD (peripheral vascular disease)   . Peptic ulcer disease 2008    + H pylori and classic symptoms, antibiotics given, also in 20's  . Chronic back pain   . Chronic constipation   . Tremor   . MI (myocardial infarction)     multiple  . Seizure 2003    vs TIA   . 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 Hospice care     due to severe CHF 2003-probably due to Actos or Avandia    Past Surgical History  Procedure Date  . Cholecystectomy 09/02  . Aorto-femoral bypass graft 1997  . Angioplasty     Multiple  . Tubal ligation   . US echocardiography     EF 77%    Family History  Problem Relation Age of Onset  . Heart disease Mother   . Pulmonary fibrosis Sister   . Cancer Sister     breast  . Diabetes Brother     History   Social History  . Marital Status: Married    Spouse Name: N/A    Number of Children: 2  .  Years of Education: N/A   Occupational History  . Retired     Forensic psychologist   Social History Main Topics  . Smoking status: Former Games developer  . Smokeless tobacco: Not on file  . Alcohol Use: No  . Drug Use: No  . Sexually Active: Not on file   Other Topics Concern  . Not on file   Social History Narrative   Patient does not get regular exercise   Review of Systems Appetite is okay Weight is stable Sleeping okay Shingles has resolved     Objective:   Physical Exam  Constitutional: She appears well-developed and well-nourished. No distress.  Neck: Normal range of motion. Neck supple.  Cardiovascular: Normal rate, regular rhythm and normal heart sounds.  Exam reveals no gallop.   No murmur heard.      occ skips  Pulmonary/Chest: Effort normal. No respiratory distress. She has wheezes. She has no rales.       Not tight but does have exp wheezes  Abdominal: Soft. There is no tenderness.  Musculoskeletal: She exhibits no  edema and no tenderness.  Lymphadenopathy:    She has no cervical adenopathy.  Neurological:       No focal weakness  Skin: No rash noted. No erythema.  Psychiatric: She has a normal mood and affect. Her behavior is normal. Judgment and thought content normal.          Assessment & Plan:

## 2010-09-13 LAB — CBC WITH DIFFERENTIAL/PLATELET
Eosinophils Relative: 2.3 % (ref 0.0–5.0)
HCT: 42.1 % (ref 36.0–46.0)
Lymphs Abs: 2 10*3/uL (ref 0.7–4.0)
RBC: 4.25 Mil/uL (ref 3.87–5.11)

## 2010-09-13 LAB — HEPATIC FUNCTION PANEL
ALT: 8 U/L (ref 0–35)
AST: 17 U/L (ref 0–37)
Albumin: 3.7 g/dL (ref 3.5–5.2)
Bilirubin, Direct: 0.1 mg/dL (ref 0.0–0.3)
Total Protein: 7.1 g/dL (ref 6.0–8.3)

## 2010-09-13 LAB — TSH: TSH: 1.11 u[IU]/mL (ref 0.35–5.50)

## 2010-09-13 LAB — BASIC METABOLIC PANEL
CO2: 36 mEq/L — ABNORMAL HIGH (ref 19–32)
Creatinine, Ser: 0.6 mg/dL (ref 0.4–1.2)
Glucose, Bld: 86 mg/dL (ref 70–99)

## 2010-10-18 NOTE — Assessment & Plan Note (Signed)
[facility]                                 ON-CALL NOTE   LYNDSEE, CASA                    MRN:          [id number]  DATE:11/28/2006                            DOB:          June 02, 1946    PHONE NUMBER:  [phone].   TIME:  4:41 p.m.   ON CALL NOTE:  The patient got sick a couple of days ago. Seemed to  rally yesterday. Was having a little worse time today. She tried calling  at about 10:00 or 10:30. Told me she spoke to the service but I never  got paged to her number. She does not really have fever. She has chronic  dyspnea and stayed on her oxygen today and has done pretty well. She is  coughing a little bit of green stuff.   PLAN:  I told her we don't phone in antibiotics, although I know her  well and she has a long standing COPD. Since it is very possible that  this is just a viral infection, I told her to see how she does over the  next day and to let me know if things are worsening. I can always see  her in the office on Monday as well but will make a decision about that,  depending on how she does.     Karie Schwalbe, MD  Electronically Signed    RIL/MedQ  DD: 11/28/2006  DT: 11/28/2006  Job #: [id number]

## 2010-10-18 NOTE — Assessment & Plan Note (Signed)
[facility]                                 ON-CALL NOTE   Vanessa [name], Vanessa [name]                      MRN:          [id number]  DATE:11/28/2006                            DOB:          28-Mar-1946    Phone call 11:15 a.m.   We called back at 11:30 and at 12 noon, left messages both times and  never heard back.  The message on my beeper was pneumonia.     Karie Schwalbe, MD  Electronically Signed    RIL/MedQ  DD: 11/28/2006  DT: 11/28/2006  Job #: [id number]

## 2010-10-25 IMAGING — CR DG ABDOMEN ACUTE W/ 1V CHEST
4 series · 4 of 4 positions shown · non-contrast
Comparison: None

CLINICAL DATA: Abdominal pain.  Vomiting.

ACUTE ABDOMEN SERIES (ABDOMEN 2 VIEW & CHEST 1 VIEW)

[w chest pa]
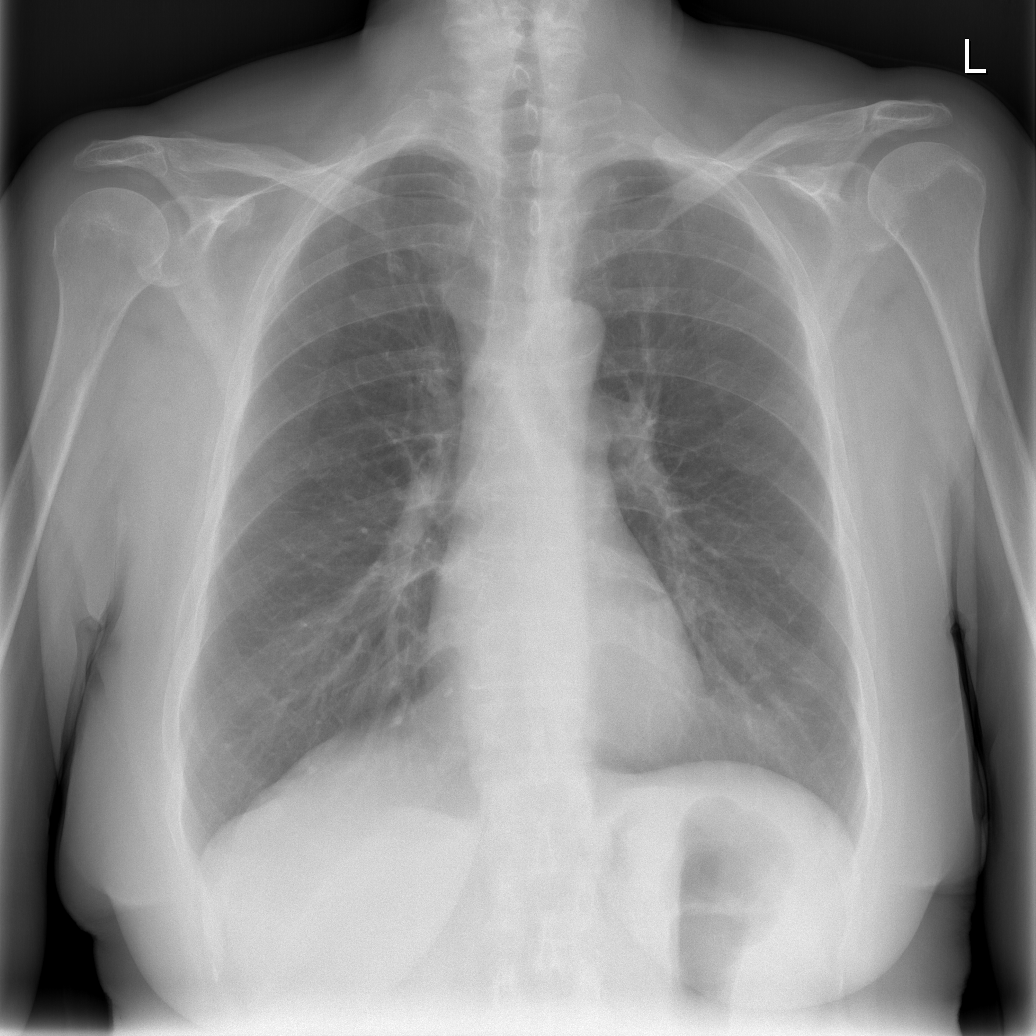

[w abdomen upright *]
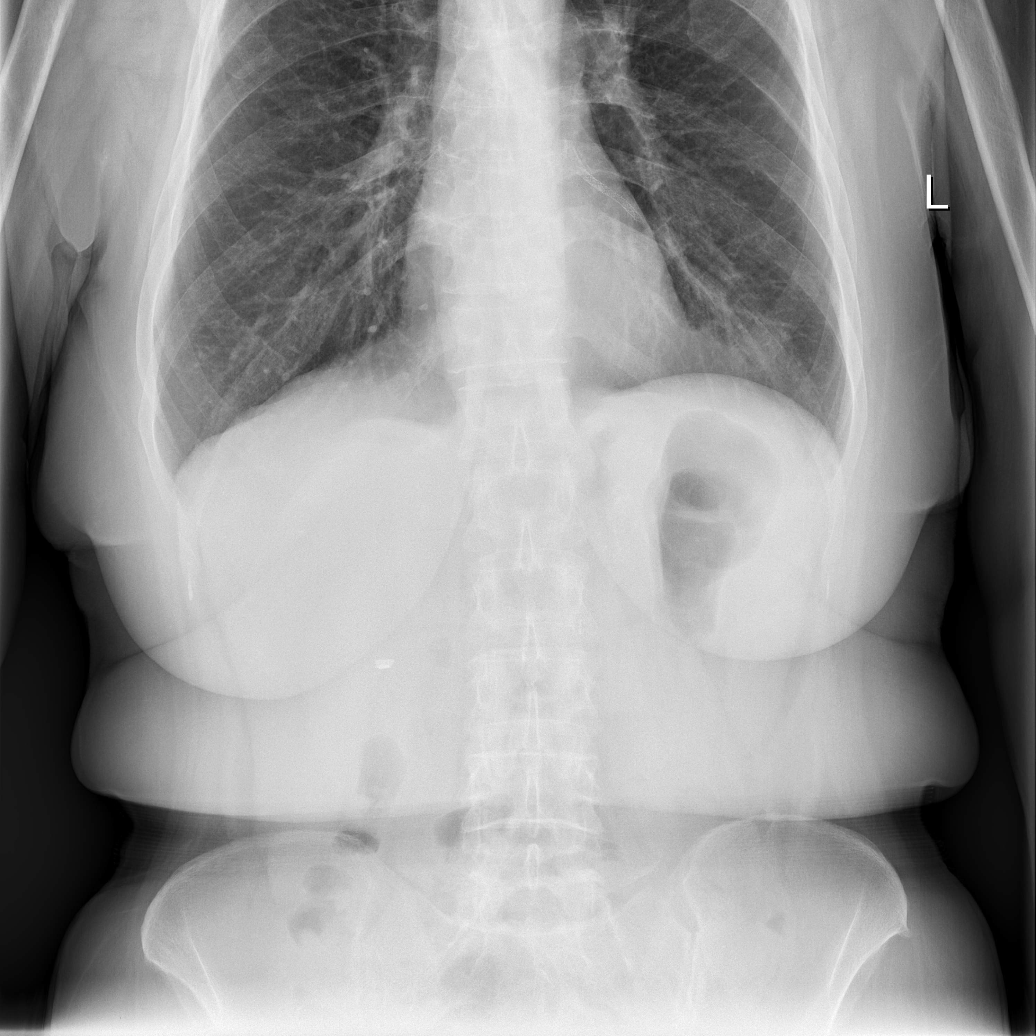

[t abdomen supine (1 of 2)]
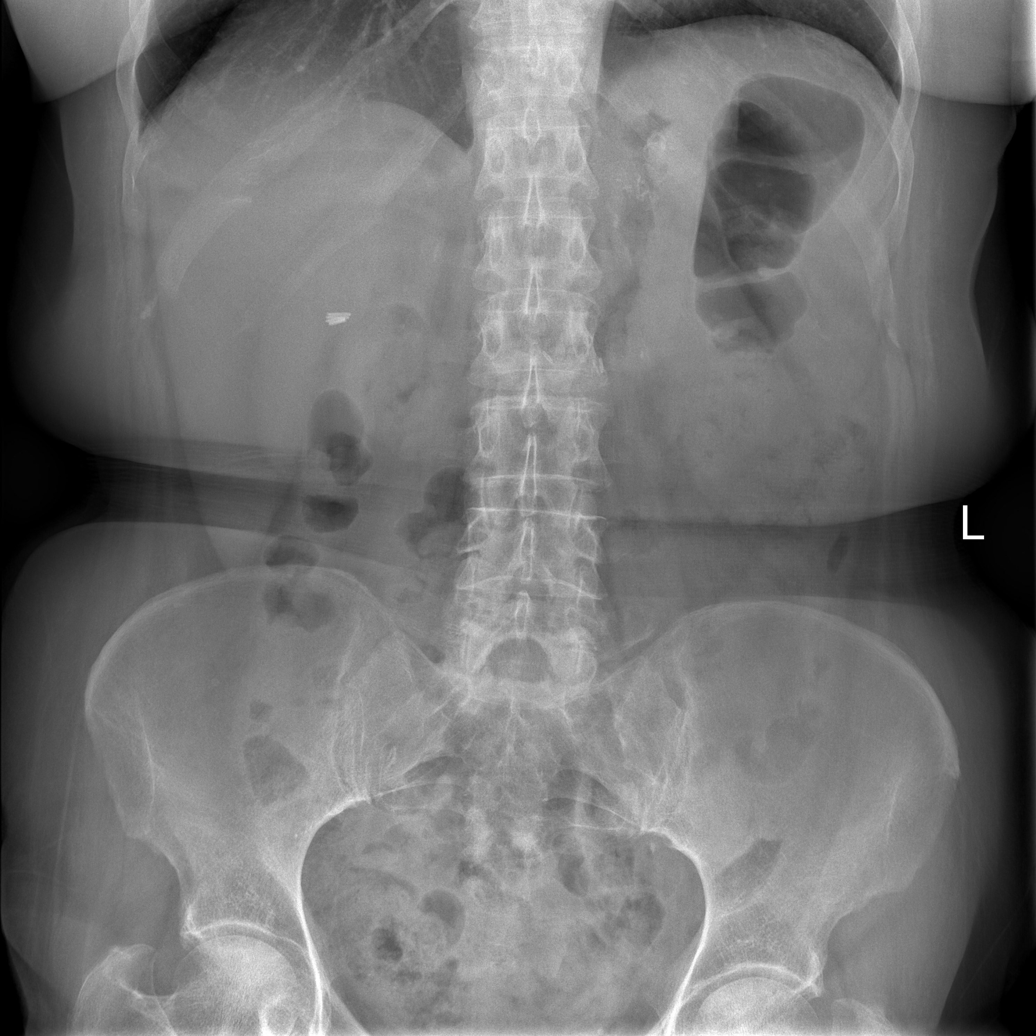

[t abdomen supine (2 of 2)]
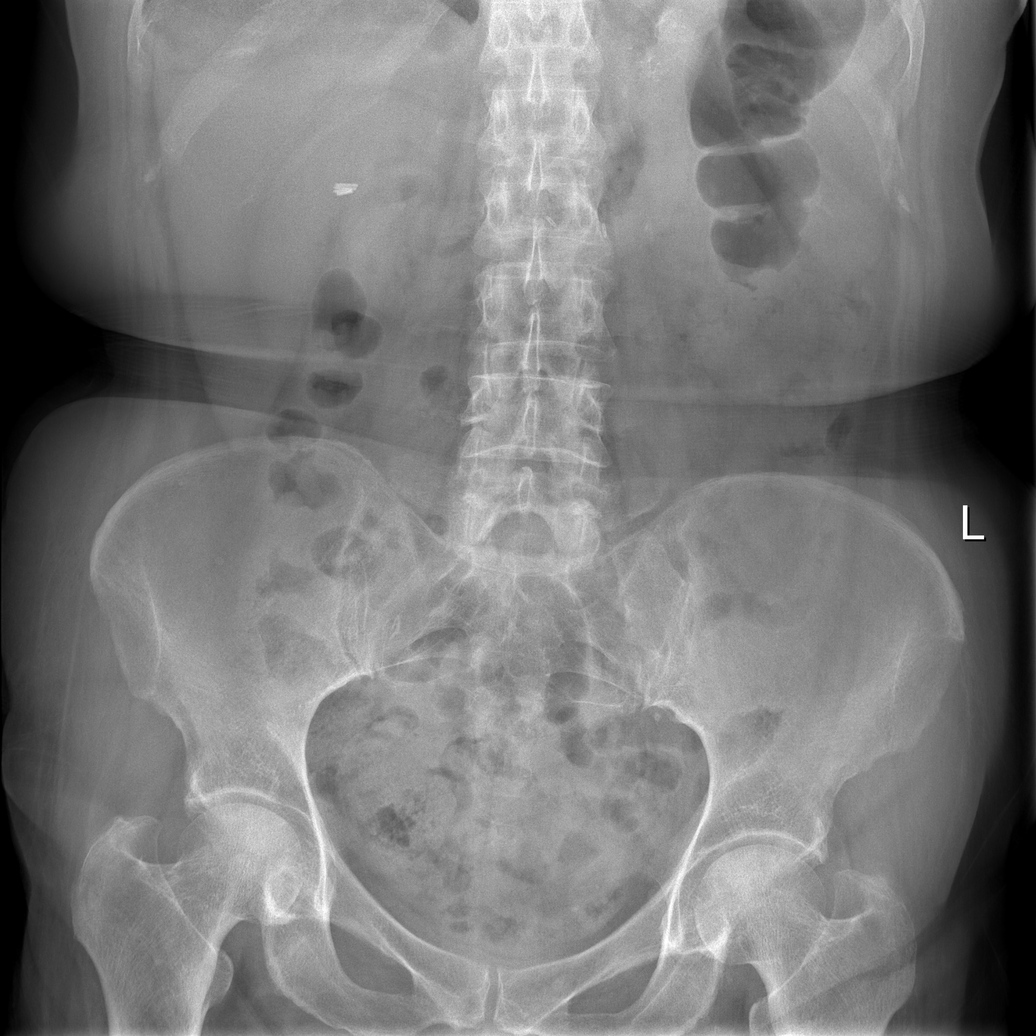

[4 of 4 positions shown; findings below may reference images not displayed]

FINDINGS: The bowel gas pattern is normal.  There is no evidence of
free air.  Surgical clips are seen from prior cholecystectomy.

Heart size and mediastinal contours are normal.  Both lungs are
clear.
IMPRESSION: 1.  Normal bowel gas pattern.
2.  No active cardiopulmonary disease.

## 2010-12-12 ENCOUNTER — Ambulatory Visit (INDEPENDENT_AMBULATORY_CARE_PROVIDER_SITE_OTHER): Payer: Medicare Other | Admitting: Internal Medicine

## 2010-12-12 ENCOUNTER — Encounter: Payer: Self-pay | Admitting: Internal Medicine

## 2010-12-12 VITALS — BP 102/60 | HR 66 | Temp 98.4°F | Ht 66.0 in | Wt 136.0 lb

## 2010-12-12 DIAGNOSIS — I2510 Atherosclerotic heart disease of native coronary artery without angina pectoris: Secondary | ICD-10-CM

## 2010-12-12 DIAGNOSIS — J019 Acute sinusitis, unspecified: Secondary | ICD-10-CM

## 2010-12-12 DIAGNOSIS — M549 Dorsalgia, unspecified: Secondary | ICD-10-CM

## 2010-12-12 DIAGNOSIS — J449 Chronic obstructive pulmonary disease, unspecified: Secondary | ICD-10-CM

## 2010-12-12 DIAGNOSIS — I5022 Chronic systolic (congestive) heart failure: Secondary | ICD-10-CM

## 2010-12-12 DIAGNOSIS — F329 Major depressive disorder, single episode, unspecified: Secondary | ICD-10-CM

## 2010-12-12 MED ORDER — MORPHINE SULFATE CR 60 MG PO TB12: 60.0000 mg | ORAL_TABLET | Freq: Two times a day (BID) | ORAL | Status: DC

## 2010-12-12 MED ORDER — AMOXICILLIN-POT CLAVULANATE 875-125 MG PO TABS: 1.0000 | ORAL_TABLET | Freq: Two times a day (BID) | ORAL | Status: AC

## 2010-12-12 MED ORDER — MORPHINE SULFATE CR 60 MG PO TB12: 60.0000 mg | ORAL_TABLET | Freq: Two times a day (BID) | ORAL | Status: AC

## 2010-12-12 NOTE — Assessment & Plan Note (Signed)
Mood has been okay No changes needed

## 2010-12-12 NOTE — Assessment & Plan Note (Signed)
occ vague chest pain Seems mainly quiet On ASA and beta blocker

## 2010-12-12 NOTE — Assessment & Plan Note (Signed)
Will continue morphine regimen

## 2010-12-12 NOTE — Assessment & Plan Note (Signed)
Compensated  weight down due to eating poorly

## 2010-12-12 NOTE — Assessment & Plan Note (Signed)
Seems like bacterial infection Will treat with augmentin

## 2010-12-12 NOTE — Progress Notes (Signed)
Subjective:    Patient ID: Vanessa [name], female    DOB: 1946/01/04, 65 y.o.   MRN: [id number]  HPI Having problems with sinus drainage Hung in throat now Started about 3 weeks ago Low grade fever Increased cough---productive of dark green and black mucus---now yellow green Breathing is worse Got smothering feeling yesterday Was waiting for today's appt  No heart trouble May have some palpitations when coughing a lot No sig chest pain---no longer has nitro Slight edema  Pain control has been okay No changes in her pain regimen  Current Outpatient Prescriptions on File Prior to Visit  Medication Sig Dispense Refill  . aspirin 81 MG tablet Take 81 mg by mouth daily.        . furosemide (LASIX) 40 MG tablet Take 40 mg by mouth daily as needed.        . metoclopramide (REGLAN) 10 MG tablet Take one by mouth 30 minutes before each meal and at bedtime       . metoprolol (TOPROL-XL) 100 MG 24 hr tablet Take 100 mg by mouth daily.        . mineral oil liquid Take one teaspoon at bedtime       . morphine (MS CONTIN) 60 MG 12 hr tablet Take 1 tablet (60 mg total) by mouth 2 (two) times daily. For chronic back pain  60 tablet  0  . morphine (MS CONTIN) 60 MG 12 hr tablet Take 1 tablet (60 mg total) by mouth every 12 (twelve) hours.  60 tablet  0  . morphine (MSIR) 15 MG tablet Take one tablet by mouth every morning and at bedtime       . omeprazole (PRILOSEC) 20 MG capsule Take 20 mg by mouth 2 (two) times daily.        Marland Kitchen PARoxetine (PAXIL) 40 MG tablet Take 40 mg by mouth daily.        . polyethylene glycol (MIRALAX) powder Take one capful with water two times a day       . Respiratory Therapy Supplies (NEBULIZER) DEVI Use as needed       . senna-docusate (SENOKOT S) 8.6-50 MG per tablet Take 2 tablets by mouth 2 (two) times daily.          No Known Allergies  Past Medical History  Diagnosis Date  . Congestive heart failure 05/03    ? diastolic (withSVT)  . COPD (chronic  obstructive pulmonary disease)   . Coronary artery disease   . Depression   . GERD (gastroesophageal reflux disease)   . PVD (peripheral vascular disease)   . Peptic ulcer disease 2008    + H pylori and classic symptoms, antibiotics given, also in 20's  . Chronic back pain   . Chronic constipation   . Tremor   . MI (myocardial infarction)     multiple  . Seizure 2003    vs TIA   . 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 Hospice care     due to severe CHF 2003-probably due to Actos or Avandia    Past Surgical History  Procedure Date  . Cholecystectomy 09/02  . Aorto-femoral bypass graft 1997  . Angioplasty     Multiple  . Tubal ligation   . US echocardiography     EF 77%    Family History  Problem Relation Age of Onset  . Heart disease Mother   . Pulmonary fibrosis Sister   . Cancer Sister     breast  .  Diabetes Brother     History   Social History  . Marital Status: Married    Spouse Name: N/A    Number of Children: 2  . Years of Education: N/A   Occupational History  . Retired     Forensic psychologist   Social History Main Topics  . Smoking status: Former Games developer  . Smokeless tobacco: Not on file  . Alcohol Use: No  . Drug Use: No  . Sexually Active: Not on file   Other Topics Concern  . Not on file   Social History Narrative   Patient does not get regular exerciseHad been on hospice due to severe CHF in 2003--probably from actos   Review of Systems Has had to sleep sitting up Appetite is off weight is down 9#---just can't eat when sick    Objective:   Physical Exam  Constitutional: She appears well-developed and well-nourished.       Uncomfortable but no distress  Neck: Normal range of motion. Neck supple. No thyromegaly present.  Cardiovascular: Normal rate, regular rhythm and normal heart sounds.  Exam reveals no gallop.   No murmur heard. Pulmonary/Chest: Effort normal. No respiratory distress. She has no wheezes. She has no rales.       Decreased  breath sounds but no crackles or wheezing  Abdominal: Soft. There is no tenderness.  Musculoskeletal: Normal range of motion. She exhibits no edema and no tenderness.  Lymphadenopathy:    She has no cervical adenopathy.  Psychiatric: She has a normal mood and affect. Her behavior is normal. Judgment and thought content normal.          Assessment & Plan:

## 2010-12-12 NOTE — Assessment & Plan Note (Signed)
Still with sig limitations Oxygen dependent No exacerbation despite the sinus infection

## 2011-02-13 ENCOUNTER — Telehealth: Payer: Self-pay | Admitting: *Deleted

## 2011-02-13 NOTE — Telephone Encounter (Signed)
Received fax from [facility] about pt's Metoclopramide (Reglan) prescription causing side effects, it has a black box warning for causing tardive dyskinesia. Per Dr.Letvak he would like for patient to try not taking this since it has been recognized to cause some serious side effects. She can call if stomach gets worse off this.  Spoke with husband about this and he stated that she's no longer taking because she ran out and right now she's not having any problems. I advised to call if anything gets worse.

## 2011-02-24 ENCOUNTER — Telehealth: Payer: Self-pay | Admitting: *Deleted

## 2011-02-24 MED ORDER — METOCLOPRAMIDE HCL 5 MG PO TABS: 5.0000 mg | ORAL_TABLET | Freq: Three times a day (TID) | ORAL | Status: DC

## 2011-02-24 NOTE — Telephone Encounter (Signed)
Okay to restart the metoclopramide Have her try 5mg  tid before meals (#90 x 2) and we can review this again at her next visit Have her call if this lower dose is not effective

## 2011-02-24 NOTE — Telephone Encounter (Signed)
Pt's husband states pt was told to stop her reglan and to call back it her symptoms worsened.  They have, and she needs something else to take.  She's having a lot of problems with nausea.  Uses [facility] in [place].

## 2011-02-24 NOTE — Telephone Encounter (Signed)
Patient notified as instructed by telephone.Medication phoned to [facility] pharmacy as instructed.

## 2011-03-13 ENCOUNTER — Ambulatory Visit (INDEPENDENT_AMBULATORY_CARE_PROVIDER_SITE_OTHER): Payer: Medicare Other | Admitting: Internal Medicine

## 2011-03-13 ENCOUNTER — Encounter: Payer: Self-pay | Admitting: Internal Medicine

## 2011-03-13 VITALS — BP 103/53 | HR 64 | Temp 98.1°F | Ht 66.0 in | Wt 132.0 lb

## 2011-03-13 DIAGNOSIS — M549 Dorsalgia, unspecified: Secondary | ICD-10-CM

## 2011-03-13 DIAGNOSIS — Z23 Encounter for immunization: Secondary | ICD-10-CM

## 2011-03-13 DIAGNOSIS — J449 Chronic obstructive pulmonary disease, unspecified: Secondary | ICD-10-CM

## 2011-03-13 DIAGNOSIS — F329 Major depressive disorder, single episode, unspecified: Secondary | ICD-10-CM

## 2011-03-13 DIAGNOSIS — J019 Acute sinusitis, unspecified: Secondary | ICD-10-CM

## 2011-03-13 MED ORDER — MORPHINE SULFATE CR 60 MG PO TB12: 60.0000 mg | ORAL_TABLET | Freq: Two times a day (BID) | ORAL | Status: DC

## 2011-03-13 MED ORDER — LEVOFLOXACIN 250 MG PO TABS: 250.0000 mg | ORAL_TABLET | Freq: Every day | ORAL | Status: DC

## 2011-03-13 MED ORDER — MORPHINE SULFATE 60 MG PO TB12: 60.0000 mg | ORAL_TABLET | Freq: Two times a day (BID) | ORAL | Status: AC

## 2011-03-13 MED ORDER — LEVOFLOXACIN 250 MG PO TABS: 250.0000 mg | ORAL_TABLET | Freq: Every day | ORAL | Status: AC

## 2011-03-13 NOTE — Assessment & Plan Note (Signed)
Mood has been okay Wean not appropriate

## 2011-03-13 NOTE — Assessment & Plan Note (Signed)
Does okay with current narcotic regimen May help her chronic dyspnea as well

## 2011-03-13 NOTE — Assessment & Plan Note (Signed)
Stable on oxygen No changes in functional status

## 2011-03-13 NOTE — Assessment & Plan Note (Signed)
Still with symptoms Antihistamines don't help Did get partial relief with augmentin Will try 3 week course of levaquin

## 2011-03-13 NOTE — Progress Notes (Signed)
Subjective:    Patient ID: Vanessa [name], female    DOB: Apr 28, 1946, 65 y.o.   MRN: [id number]  HPI Doing fine No new concerns Some sinus problems still---will get down in throat and occ even chokes her Uses saline Antibiotic helped in summer---thinks she needs more No help from antihistamine  Cough productive of light green mucus---yellow other times Breathing has been okay---uses oxygen all the time at 2l/min. Occ boosts to 3l/min  Tried off the reglan Started with nausea after just a few days Back to full pill tid  Mood has been fine Not persistently depressed  Still satisfied with her pain regimen  Current Outpatient Prescriptions on File Prior to Visit  Medication Sig Dispense Refill  . aspirin 81 MG tablet Take 81 mg by mouth daily.        . furosemide (LASIX) 40 MG tablet Take 40 mg by mouth daily as needed.        . metoclopramide (REGLAN) 10 MG tablet Take one by mouth 30 minutes before each meal and at bedtime       . metoprolol (TOPROL-XL) 100 MG 24 hr tablet Take 100 mg by mouth daily.        . mineral oil liquid Take one teaspoon at bedtime       . morphine (MS CONTIN) 60 MG 12 hr tablet Take 1 tablet (60 mg total) by mouth 2 (two) times daily. For chronic back pain  60 tablet  0  . morphine (MS CONTIN) 60 MG 12 hr tablet Take 1 tablet (60 mg total) by mouth every 12 (twelve) hours.  60 tablet  0  . morphine (MSIR) 15 MG tablet Take one tablet by mouth every morning and at bedtime       . omeprazole (PRILOSEC) 20 MG capsule Take 20 mg by mouth 2 (two) times daily.        Marland Kitchen PARoxetine (PAXIL) 40 MG tablet Take 40 mg by mouth daily.        . polyethylene glycol (MIRALAX) powder Take one capful with water two times a day       . Respiratory Therapy Supplies (NEBULIZER) DEVI Use as needed       . senna-docusate (SENOKOT S) 8.6-50 MG per tablet Take 2 tablets by mouth 2 (two) times daily.          No Known Allergies  Past Medical History  Diagnosis Date  .  Congestive heart failure 05/03    ? diastolic (withSVT)  . COPD (chronic obstructive pulmonary disease)   . Coronary artery disease   . Depression   . GERD (gastroesophageal reflux disease)   . PVD (peripheral vascular disease)   . Peptic ulcer disease 2008    + H pylori and classic symptoms, antibiotics given, also in 20's  . Chronic back pain   . Chronic constipation   . Tremor   . MI (myocardial infarction)     multiple  . Seizure 2003    vs TIA   . 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 Hospice care     due to severe CHF 2003-probably due to Actos or Avandia    Past Surgical History  Procedure Date  . Cholecystectomy 09/02  . Aorto-femoral bypass graft 1997  . Angioplasty     Multiple  . Tubal ligation   . US echocardiography     EF 77%    Family History  Problem Relation Age of Onset  . Heart disease Mother   .  Pulmonary fibrosis Sister   . Cancer Sister     breast  . Diabetes Brother     History   Social History  . Marital Status: Married    Spouse Name: N/A    Number of Children: 2  . Years of Education: N/A   Occupational History  . Retired     Forensic psychologist   Social History Main Topics  . Smoking status: Former Games developer  . Smokeless tobacco: Never Used  . Alcohol Use: No  . Drug Use: No  . Sexually Active: Not on file   Other Topics Concern  . Not on file   Social History Narrative   Patient does not get regular exerciseHad been on hospice due to severe CHF in 2003--probably from actos   Review of Systems Sleeps a lot--goes to sleep 3-4AM, sleeps till noon Appetite is fair Weight down a couple of pounds    Objective:   Physical Exam  Constitutional: No distress.       Slim On oxygen   HENT:  Mouth/Throat: Oropharynx is clear and moist. No oropharyngeal exudate.       No sinus tenderness Mild nasal inflammation  Neck: Normal range of motion. Neck supple.  Cardiovascular: Normal rate, regular rhythm and normal heart sounds.  Exam reveals  no gallop.   No murmur heard. Pulmonary/Chest: Effort normal. No respiratory distress. She has no wheezes. She has no rales.       Decreased breath sounds but clear  Abdominal: Soft. There is no tenderness.  Musculoskeletal: She exhibits no edema and no tenderness.  Lymphadenopathy:    She has no cervical adenopathy.  Psychiatric: She has a normal mood and affect. Her behavior is normal. Judgment and thought content normal.          Assessment & Plan:

## 2011-03-14 ENCOUNTER — Other Ambulatory Visit: Payer: Self-pay | Admitting: *Deleted

## 2011-03-14 MED ORDER — METOCLOPRAMIDE HCL 10 MG PO TABS: ORAL_TABLET | ORAL | Status: DC

## 2011-05-19 ENCOUNTER — Ambulatory Visit (INDEPENDENT_AMBULATORY_CARE_PROVIDER_SITE_OTHER): Payer: Medicare Other | Admitting: Family Medicine

## 2011-05-19 ENCOUNTER — Encounter: Payer: Self-pay | Admitting: Family Medicine

## 2011-05-19 ENCOUNTER — Telehealth: Payer: Self-pay | Admitting: Radiology

## 2011-05-19 ENCOUNTER — Ambulatory Visit (INDEPENDENT_AMBULATORY_CARE_PROVIDER_SITE_OTHER)
Admission: RE | Admit: 2011-05-19 | Discharge: 2011-05-19 | Disposition: A | Discharge status: Home / Self Care | Payer: Medicare Other | Source: Ambulatory Visit | Attending: Family Medicine | Admitting: Family Medicine

## 2011-05-19 VITALS — BP 110/60 | HR 86 | Temp 98.3°F | Ht 67.0 in | Wt 126.1 lb

## 2011-05-19 DIAGNOSIS — J189 Pneumonia, unspecified organism: Secondary | ICD-10-CM

## 2011-05-19 DIAGNOSIS — R05 Cough: Secondary | ICD-10-CM

## 2011-05-19 DIAGNOSIS — J441 Chronic obstructive pulmonary disease with (acute) exacerbation: Secondary | ICD-10-CM

## 2011-05-19 DIAGNOSIS — B999 Unspecified infectious disease: Secondary | ICD-10-CM

## 2011-05-19 MED ORDER — LEVOFLOXACIN 500 MG PO TABS: 500.0000 mg | ORAL_TABLET | Freq: Every day | ORAL | Status: DC

## 2011-05-19 MED ORDER — PREDNISONE 20 MG PO TABS: ORAL_TABLET | ORAL | Status: DC

## 2011-05-19 MED ORDER — METHYLPREDNISOLONE ACETATE PF 40 MG/ML IJ SUSP
80.0000 mg | Freq: Once | INTRAMUSCULAR | Status: AC
  Administered 2011-05-19: 80 mg via INTRAMUSCULAR

## 2011-05-19 NOTE — Patient Instructions (Signed)
Recheck with Dr. Patsy Lager on Wednesday

## 2011-05-19 NOTE — Telephone Encounter (Signed)
known 

## 2011-05-19 NOTE — Telephone Encounter (Signed)
[facility] called a chest results on this patient, probable pneumonia.

## 2011-05-19 NOTE — Progress Notes (Signed)
Patient Name: Vanessa [name] Date of Birth: 1946-05-13 Medical Record Number: [id number] Gender: female  History of Present Illness:  Vanessa [name] is a 65 y.o. very pleasant female with significant O2 dependent COPD, and CHF patient who presents with the following:  Having some SOB, productive sputum, coughing and shortness of breath. Some fever, gone now.  Nothing is being coughed up but feels loose in her chest and hurting in her lungs.  Does not feel worse today compared to the last few days. Has been ongoing for close to a week.  No LE edema  Pneumonia: Patient complains of evaluation of cough, evaluation of fever, possible pneumonia, evaluation of chest pain. Patient describes symptoms of shortness of breath at rest, bilateral middle back pain, pleuritic chest pain, chills without rigors, cough, fever to subjective, now resolved, sputum production and wheezing. Symptoms began 5 days ago and are unchanged since that time. Patient denies nausea and vomiting. Treatment thus far includes OTC analgesics/antipyretics: ineffective Past pulmonary history is significant for COPD and emphysema  Patient Active Problem List  Diagnoses  . HYPERCHOLESTEROLEMIA  . DEPRESSION  . CORONARY ARTERY DISEASE  . SUPRAVENTRICULAR TACHYCARDIA  . CHRONIC SYSTOLIC HEART FAILURE  . PERIPHERAL VASCULAR DISEASE  . COPD  . GERD  . PEPTIC ULCER DISEASE  . BACK PAIN, CHRONIC  . TREMOR, ESSENTIAL  . Acute sinusitis, unspecified   Past Medical History  Diagnosis Date  . Congestive heart failure 05/03    ? diastolic (withSVT)  . COPD (chronic obstructive pulmonary disease)   . Coronary artery disease   . Depression   . GERD (gastroesophageal reflux disease)   . PVD (peripheral vascular disease)   . Peptic ulcer disease 2008    + H pylori and classic symptoms, antibiotics given, also in 20's  . Chronic back pain   . Chronic constipation   . Tremor   . MI (myocardial infarction)     multiple    . Seizure 2003    vs TIA   . 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 Hospice care     due to severe CHF 2003-probably due to Actos or Avandia   Past Surgical History  Procedure Date  . Cholecystectomy 09/02  . Aorto-femoral bypass graft 1997  . Angioplasty     Multiple  . Tubal ligation   . US echocardiography     EF 77%   History  Substance Use Topics  . Smoking status: Former Games developer  . Smokeless tobacco: Never Used  . Alcohol Use: No   Family History  Problem Relation Age of Onset  . Heart disease Mother   . Pulmonary fibrosis Sister   . Cancer Sister     breast  . Diabetes Brother    No Known Allergies Current Outpatient Prescriptions on File Prior to Visit  Medication Sig Dispense Refill  . aspirin 81 MG tablet Take 81 mg by mouth daily.        . furosemide (LASIX) 40 MG tablet Take 40 mg by mouth daily as needed.        . metoCLOPramide (REGLAN) 10 MG tablet Take one by mouth 30 minutes before each meal and at bedtime  360 tablet  1  . metoprolol (TOPROL-XL) 100 MG 24 hr tablet Take 100 mg by mouth daily.        . mineral oil liquid Take one teaspoon at bedtime       . morphine (MS CONTIN) 60 MG 12 hr tablet Take 1 tablet (60  mg total) by mouth 2 (two) times daily. For chronic back pain  60 tablet  0  . omeprazole (PRILOSEC) 20 MG capsule Take 20 mg by mouth 2 (two) times daily.        Marland Kitchen PARoxetine (PAXIL) 40 MG tablet Take 40 mg by mouth daily.        . polyethylene glycol (MIRALAX) powder Take one capful with water two times a day       . Respiratory Therapy Supplies (NEBULIZER) DEVI Use as needed       . senna-docusate (SENOKOT S) 8.6-50 MG per tablet Take 2 tablets by mouth 2 (two) times daily.          Review of Systems: ROS: GEN: Acute illness details above GI: Tolerating PO intake, but decreased No chest pain No abdominal pain GU: maintaining adequate hydration and urination Pulm: + SOB Interactive and getting along well at home. Otherwise, the pertinent  positives and negatives are listed above and in the HPI, otherwise a full review of systems has been reviewed and is negative unless noted positive.   Physical Examination: Filed Vitals:   05/19/11 1506  BP: 110/60  Pulse: 86  Temp: 98.3 F (36.8 C)  TempSrc: Oral  Height: 5\' 7"  (1.702 m)  Weight: 126 lb 1.9 oz (57.208 kg)  SpO2: 85%  Pulse Ox 90% when Dr. Patsy Lager manually checked it himself. Karlo Goeden MD  Body mass index is 19.75 kg/(m^2).   GEN: WDWN, NAD, mildly ill appearing patient, A & O x 3 HEENT: Atraumatic, Normocephalic. Neck supple. No masses, No LAD. Ears and Nose: No external deformity. CV: RRR, No M/G/R. No JVD. No thrill. No extra heart sounds. PULM: diffuse scattered wheezing. No increased WOB. Scattered rhonchi and LLL rare crackles. ABD: S, NT, ND, +BS EXTR: No c/c/e NEURO Normal gait.  PSYCH: Normally interactive. Conversant. Not depressed or anxious appearing.  Calm demeanor.    Assessment and Plan:  1. Pneumonia due to infectious agent    2. Cough  DG Chest 2 View, levofloxacin (LEVAQUIN) 500 MG tablet, predniSONE (DELTASONE) 20 MG tablet  3. COPD exacerbation  levofloxacin (LEVAQUIN) 500 MG tablet, predniSONE (DELTASONE) 20 MG tablet, methylPREDNISolone acetate PF (DEPO-MEDROL) injection 80 mg   CXR, AP and Lateral Indication: Cough, shortness or breath: Findings:  LLL infiltrate  Chest xray and exam concerning for PNA Pulse ox at 90% in a patient with COPD and CHF. Discussed admission, patient declined, agreeable to outpatient care with close follow-up.  IM Depo-Medrol 80 mg now LVQ Prednisone 40 mg for the rest of the week Recheck with me in 2 days  Orders Placed This Encounter  Procedures  . DG Chest 2 View    Standing Status: Future     Number of Occurrences: 1     Standing Expiration Date: 07/18/2012    Order Specific Question:  Preferred imaging location?    Answer:  [facility]    Order Specific Question:  Reason for  exam:    Answer:  cough, prod cough. COPD. acute illness    Medications Discontinued During This Encounter  Medication Reason  . morphine (MS CONTIN) 60 MG 12 hr tablet   . morphine (MSIR) 15 MG tablet

## 2011-05-20 ENCOUNTER — Other Ambulatory Visit: Payer: Self-pay | Admitting: Internal Medicine

## 2011-05-20 DIAGNOSIS — J441 Chronic obstructive pulmonary disease with (acute) exacerbation: Secondary | ICD-10-CM

## 2011-05-20 DIAGNOSIS — R05 Cough: Secondary | ICD-10-CM

## 2011-05-20 MED ORDER — PREDNISONE 20 MG PO TABS: ORAL_TABLET | ORAL | Status: DC

## 2011-05-20 MED ORDER — LEVOFLOXACIN 500 MG PO TABS: 500.0000 mg | ORAL_TABLET | Freq: Every day | ORAL | Status: AC

## 2011-05-20 NOTE — Telephone Encounter (Signed)
Rx was sent to [facility] instead of [facility] in [place].  Resubmitted Rx''s to [facility].

## 2011-05-21 ENCOUNTER — Ambulatory Visit: Payer: Medicare Other | Admitting: Family Medicine

## 2011-06-12 ENCOUNTER — Ambulatory Visit (INDEPENDENT_AMBULATORY_CARE_PROVIDER_SITE_OTHER): Payer: Medicare Other | Admitting: Internal Medicine

## 2011-06-12 ENCOUNTER — Encounter: Payer: Self-pay | Admitting: Internal Medicine

## 2011-06-12 DIAGNOSIS — J449 Chronic obstructive pulmonary disease, unspecified: Secondary | ICD-10-CM

## 2011-06-12 DIAGNOSIS — Z23 Encounter for immunization: Secondary | ICD-10-CM

## 2011-06-12 DIAGNOSIS — I5022 Chronic systolic (congestive) heart failure: Secondary | ICD-10-CM

## 2011-06-12 DIAGNOSIS — M549 Dorsalgia, unspecified: Secondary | ICD-10-CM

## 2011-06-12 DIAGNOSIS — F329 Major depressive disorder, single episode, unspecified: Secondary | ICD-10-CM

## 2011-06-12 MED ORDER — METOCLOPRAMIDE HCL 10 MG PO TABS: ORAL_TABLET | ORAL | Status: DC

## 2011-06-12 MED ORDER — MORPHINE SULFATE CR 60 MG PO TB12: 60.0000 mg | ORAL_TABLET | Freq: Two times a day (BID) | ORAL | Status: DC

## 2011-06-12 MED ORDER — METOPROLOL SUCCINATE ER 100 MG PO TB24: 100.0000 mg | ORAL_TABLET | Freq: Every day | ORAL | Status: DC

## 2011-06-12 MED ORDER — PAROXETINE HCL 40 MG PO TABS: 40.0000 mg | ORAL_TABLET | Freq: Every day | ORAL | Status: DC

## 2011-06-12 MED ORDER — OMEPRAZOLE 20 MG PO CPDR: 20.0000 mg | DELAYED_RELEASE_CAPSULE | Freq: Two times a day (BID) | ORAL | Status: DC

## 2011-06-12 NOTE — Assessment & Plan Note (Signed)
No longer an active problem 

## 2011-06-12 NOTE — Progress Notes (Signed)
Subjective:    Patient ID: Vanessa [name], female    DOB: Sep 03, 1945, 66 y.o.   MRN: [id number]  HPI Feels her pneumonia is better Breathing is back to normal Not coughing anymore  Has had pain in right shoulder Can't lift her shoulder at all No known injury Did try OTC joint med--no help  No chest pain No SOB No edema Occ mild dizziness--no syncope  Mood is fine Sleeping okay  Pain control is still fine on morphine Bowels are okay  Current Outpatient Prescriptions on File Prior to Visit  Medication Sig Dispense Refill  . aspirin 81 MG tablet Take 81 mg by mouth daily.        . furosemide (LASIX) 40 MG tablet Take 40 mg by mouth daily as needed.        . mineral oil liquid Take one teaspoon at bedtime       . morphine (MS CONTIN) 60 MG 12 hr tablet Take 1 tablet (60 mg total) by mouth 2 (two) times daily. For chronic back pain  60 tablet  0  . polyethylene glycol (MIRALAX) powder Take one capful with water two times a day       . predniSONE (DELTASONE) 20 MG tablet 2 tabs po daily for 1 week, then 1 tab po x 3 days  17 tablet  0  . Respiratory Therapy Supplies (NEBULIZER) DEVI Use as needed       . senna-docusate (SENOKOT S) 8.6-50 MG per tablet Take 2 tablets by mouth 2 (two) times daily.          No Known Allergies  Past Medical History  Diagnosis Date  . Congestive heart failure 05/03    ? diastolic (withSVT)  . COPD (chronic obstructive pulmonary disease)   . Coronary artery disease   . Depression   . GERD (gastroesophageal reflux disease)   . PVD (peripheral vascular disease)   . Peptic ulcer disease 2008    + H pylori and classic symptoms, antibiotics given, also in 20's  . Chronic back pain   . Chronic constipation   . Tremor   . MI (myocardial infarction)     multiple  . Seizure 2003    vs TIA   . 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 Hospice care     due to severe CHF 2003-probably due to Actos or Avandia    Past Surgical History  Procedure Date  .  Cholecystectomy 09/02  . Aorto-femoral bypass graft 1997  . Angioplasty     Multiple  . Tubal ligation   . US echocardiography     EF 77%    Family History  Problem Relation Age of Onset  . Heart disease Mother   . Pulmonary fibrosis Sister   . Cancer Sister     breast  . Diabetes Brother     History   Social History  . Marital Status: Married    Spouse Name: N/A    Number of Children: 2  . Years of Education: N/A   Occupational History  . Retired     Forensic psychologist   Social History Main Topics  . Smoking status: Former Games developer  . Smokeless tobacco: Never Used  . Alcohol Use: No  . Drug Use: No  . Sexually Active: Not on file   Other Topics Concern  . Not on file   Social History Narrative   Patient does not get regular exerciseHad been on hospice due to severe CHF in 2003--probably from actos  Review of Systems No urinary problems Sleeps okay     Objective:   Physical Exam  Constitutional: She appears well-developed. No distress.       thin  Neck: Normal range of motion. Neck supple.  Cardiovascular: Normal rate, regular rhythm, normal heart sounds and intact distal pulses.  Exam reveals no gallop.   No murmur heard. Pulmonary/Chest: Effort normal and breath sounds normal. No respiratory distress. She has no wheezes. She has no rales.  Abdominal: Soft. There is no tenderness.  Lymphadenopathy:    She has no cervical adenopathy.  Psychiatric: She has a normal mood and affect. Her behavior is normal. Judgment and thought content normal.          Assessment & Plan:

## 2011-06-12 NOTE — Assessment & Plan Note (Signed)
Doing well on the morphine

## 2011-06-12 NOTE — Assessment & Plan Note (Signed)
Stable on the paroxetine Will continue

## 2011-06-12 NOTE — Progress Notes (Signed)
Addended by: Sueanne Margarita on: 06/12/2011 04:04 PM   Modules accepted: Orders

## 2011-06-12 NOTE — Assessment & Plan Note (Signed)
Stable respiratory status Over the infection Still on oxygen No changes needed

## 2011-09-11 ENCOUNTER — Encounter: Payer: Self-pay | Admitting: Internal Medicine

## 2011-09-11 ENCOUNTER — Ambulatory Visit (INDEPENDENT_AMBULATORY_CARE_PROVIDER_SITE_OTHER): Payer: Medicare Other | Admitting: Internal Medicine

## 2011-09-11 VITALS — BP 100/62 | HR 65 | Temp 97.7°F | Ht 66.0 in | Wt 123.0 lb

## 2011-09-11 DIAGNOSIS — J449 Chronic obstructive pulmonary disease, unspecified: Secondary | ICD-10-CM

## 2011-09-11 DIAGNOSIS — F3289 Other specified depressive episodes: Secondary | ICD-10-CM

## 2011-09-11 DIAGNOSIS — K219 Gastro-esophageal reflux disease without esophagitis: Secondary | ICD-10-CM

## 2011-09-11 DIAGNOSIS — F329 Major depressive disorder, single episode, unspecified: Secondary | ICD-10-CM

## 2011-09-11 DIAGNOSIS — J4489 Other specified chronic obstructive pulmonary disease: Secondary | ICD-10-CM

## 2011-09-11 DIAGNOSIS — E7800 Pure hypercholesterolemia, unspecified: Secondary | ICD-10-CM

## 2011-09-11 DIAGNOSIS — M549 Dorsalgia, unspecified: Secondary | ICD-10-CM

## 2011-09-11 DIAGNOSIS — I2510 Atherosclerotic heart disease of native coronary artery without angina pectoris: Secondary | ICD-10-CM

## 2011-09-11 MED ORDER — MORPHINE SULFATE CR 60 MG PO TB12: 60.0000 mg | ORAL_TABLET | Freq: Two times a day (BID) | ORAL | Status: DC

## 2011-09-11 NOTE — Assessment & Plan Note (Signed)
Controlled with meds

## 2011-09-11 NOTE — Assessment & Plan Note (Signed)
Seems to be quiet Due for labs No change

## 2011-09-11 NOTE — Progress Notes (Signed)
Subjective:    Patient ID: Vanessa [name], female    DOB: 05/22/1946, 66 y.o.   MRN: [id number]  HPI Doing okay No new concerns  Appetite never great Discussed having to eat inbetween meals---she does have cheese crackers  Breathing okay Regular cough DOE with walking or any activities Very tired after shower---"I give out" No wheezing  Still satisfied with pain control  No heart problems No chest pain Occ racing heart--usually at rest and very brief  Mood is okay  Current Outpatient Prescriptions on File Prior to Visit  Medication Sig Dispense Refill  . aspirin 81 MG tablet Take 81 mg by mouth daily.        . furosemide (LASIX) 40 MG tablet Take 40 mg by mouth daily as needed.        . metoCLOPramide (REGLAN) 10 MG tablet Take one by mouth 30 minutes before each meal and at bedtime  360 tablet  1  . metoprolol (TOPROL-XL) 100 MG 24 hr tablet Take 1 tablet (100 mg total) by mouth daily.  90 tablet  3  . mineral oil liquid Take one teaspoon at bedtime       . omeprazole (PRILOSEC) 20 MG capsule Take 1 capsule (20 mg total) by mouth 2 (two) times daily.  180 capsule  3  . PARoxetine (PAXIL) 40 MG tablet Take 1 tablet (40 mg total) by mouth daily.  90 tablet  3  . polyethylene glycol (MIRALAX) powder Take one capful with water two times a day       . Respiratory Therapy Supplies (NEBULIZER) DEVI Use as needed       . senna-docusate (SENOKOT S) 8.6-50 MG per tablet Take 2 tablets by mouth 2 (two) times daily.          No Known Allergies  Past Medical History  Diagnosis Date  . Congestive heart failure 05/03    ? diastolic (withSVT)  . COPD (chronic obstructive pulmonary disease)   . Coronary artery disease   . Depression   . GERD (gastroesophageal reflux disease)   . PVD (peripheral vascular disease)   . Peptic ulcer disease 2008    + H pylori and classic symptoms, antibiotics given, also in 20's  . Chronic back pain   . Chronic constipation   . Tremor   . MI  (myocardial infarction)     multiple  . Seizure 2003    vs TIA   . 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 Hospice care     due to severe CHF 2003-probably due to Actos or Avandia    Past Surgical History  Procedure Date  . Cholecystectomy 09/02  . Aorto-femoral bypass graft 1997  . Angioplasty     Multiple  . Tubal ligation   . US echocardiography     EF 77%    Family History  Problem Relation Age of Onset  . Heart disease Mother   . Pulmonary fibrosis Sister   . Cancer Sister     breast  . Diabetes Brother     History   Social History  . Marital Status: Married    Spouse Name: N/A    Number of Children: 2  . Years of Education: N/A   Occupational History  . Retired     Forensic psychologist   Social History Main Topics  . Smoking status: Former Games developer  . Smokeless tobacco: Never Used  . Alcohol Use: No  . Drug Use: No  . Sexually Active: Not on file  Other Topics Concern  . Not on file   Social History Narrative   Patient does not get regular exerciseHad been on hospice due to severe CHF in 2003--probably from actos   Review of Systems Sleeps okay Bowels are fairly good "for me"     Objective:   Physical Exam  Constitutional: She appears well-developed and well-nourished. No distress.  Neck: Normal range of motion. Neck supple. No thyromegaly present.  Cardiovascular: Normal rate, regular rhythm and normal heart sounds.  Exam reveals no gallop.   No murmur heard.      Faint distal pulses  Pulmonary/Chest: Effort normal. No respiratory distress. She has no wheezes. She has no rales.       Little air movement but not tight   Abdominal: Soft. There is no tenderness.  Musculoskeletal: She exhibits no edema and no tenderness.  Lymphadenopathy:    She has no cervical adenopathy.  Psychiatric: She has a normal mood and affect. Her behavior is normal.          Assessment & Plan:

## 2011-09-11 NOTE — Assessment & Plan Note (Signed)
stable on the med Will continue

## 2011-09-11 NOTE — Assessment & Plan Note (Signed)
Fairly severe but stable NYHA class 3 symptoms No change Continuous oxygen

## 2011-09-11 NOTE — Assessment & Plan Note (Signed)
Does okay with the chronic narcotics

## 2011-09-12 LAB — CBC WITH DIFFERENTIAL/PLATELET
Basophils Absolute: 0.1 10*3/uL (ref 0.0–0.1)
Eosinophils Relative: 2.2 % (ref 0.0–5.0)
Hemoglobin: 13.1 g/dL (ref 12.0–15.0)
Lymphocytes Relative: 35.1 % (ref 12.0–46.0)
Monocytes Relative: 9.6 % (ref 3.0–12.0)
Neutro Abs: 2.9 10*3/uL (ref 1.4–7.7)
RDW: 13.2 % (ref 11.5–14.6)
WBC: 5.6 10*3/uL (ref 4.5–10.5)

## 2011-09-12 LAB — LIPID PANEL: Cholesterol: 215 mg/dL — ABNORMAL HIGH (ref 0–200)

## 2011-09-12 LAB — BASIC METABOLIC PANEL
BUN: 9 mg/dL (ref 6–23)
CO2: 34 mEq/L — ABNORMAL HIGH (ref 19–32)
Chloride: 95 mEq/L — ABNORMAL LOW (ref 96–112)
Creatinine, Ser: 0.5 mg/dL (ref 0.4–1.2)
GFR: 145.07 mL/min (ref >60.00)
Potassium: 5 mEq/L (ref 3.5–5.1)

## 2011-09-12 LAB — HEPATIC FUNCTION PANEL
Alkaline Phosphatase: 47 U/L (ref 39–117)
Bilirubin, Direct: 0.1 mg/dL (ref 0.0–0.3)
Total Protein: 6.8 g/dL (ref 6.0–8.3)

## 2011-09-12 LAB — TSH: TSH: 0.9 u[IU]/mL (ref 0.35–5.50)

## 2011-09-15 ENCOUNTER — Encounter: Payer: Self-pay | Admitting: *Deleted

## 2011-10-24 ENCOUNTER — Telehealth: Payer: Self-pay | Admitting: Internal Medicine

## 2011-10-24 NOTE — Telephone Encounter (Signed)
Caller: Jaquisha/Patient; PCP: [facility] CB#: [phone]; Call regarding Nausea onset 10/23/11 after eating roasted corn.  Abdominal cramps rated at 1-2 of 10.  Diarrhea on 10/23/11. Reports one episode of vomiting after consuming a piece of cornbread.    Emergent sx ruled out.  Home care and parameters for callback given per Nausea or Vomiting protocol.

## 2011-10-25 NOTE — Telephone Encounter (Signed)
We address the issue if her symptoms persist

## 2011-11-03 ENCOUNTER — Ambulatory Visit (INDEPENDENT_AMBULATORY_CARE_PROVIDER_SITE_OTHER): Payer: Medicare Other | Admitting: Internal Medicine

## 2011-11-03 ENCOUNTER — Encounter: Payer: Self-pay | Admitting: Internal Medicine

## 2011-11-03 ENCOUNTER — Telehealth: Payer: Self-pay | Admitting: Internal Medicine

## 2011-11-03 VITALS — BP 120/80 | HR 60 | Temp 98.3°F | Resp 16 | Wt 118.0 lb

## 2011-11-03 DIAGNOSIS — J441 Chronic obstructive pulmonary disease with (acute) exacerbation: Secondary | ICD-10-CM

## 2011-11-03 DIAGNOSIS — J209 Acute bronchitis, unspecified: Secondary | ICD-10-CM

## 2011-11-03 MED ORDER — IPRATROPIUM-ALBUTEROL 0.5-2.5 (3) MG/3ML IN SOLN: 3.0000 mL | Freq: Four times a day (QID) | RESPIRATORY_TRACT | Status: DC | PRN

## 2011-11-03 MED ORDER — LEVOFLOXACIN 500 MG PO TABS: 500.0000 mg | ORAL_TABLET | Freq: Every day | ORAL | Status: AC

## 2011-11-03 MED ORDER — PREDNISONE 20 MG PO TABS: 40.0000 mg | ORAL_TABLET | Freq: Every day | ORAL | Status: AC

## 2011-11-03 MED ORDER — METHYLPREDNISOLONE ACETATE 40 MG/ML IJ SUSP
40.0000 mg | Freq: Once | INTRAMUSCULAR | Status: AC
  Administered 2011-11-03: 40 mg via INTRAMUSCULAR

## 2011-11-03 NOTE — Telephone Encounter (Signed)
Will evaluate then 

## 2011-11-03 NOTE — Assessment & Plan Note (Addendum)
Will give shot of depomedrol Prednisone, duoneb for nebulizer

## 2011-11-03 NOTE — Assessment & Plan Note (Signed)
Clearly has respiratory illness Not clear if bacterial but given resp compromise, will aggresively treat with levaquin No CXR for now as it wouldn't change Rx

## 2011-11-03 NOTE — Telephone Encounter (Signed)
Caller: Vanessa [name]; PCP: [facility] CB#: [phone];UJWJ regarding On Oxygen for COPD  and Is Having Unusual SOB; Onset 11/02/11.  Caller states Oxygen increased and she is breathing normally for her at this time.  Emergent sx ruled out.  Home care for the interim and parameters for callback given.  See provider within 24 hours per Breathing Problems protocol.  Appointment w/ Dr. Alphonsus Sias at 1400.

## 2011-11-03 NOTE — Progress Notes (Signed)
Subjective:    Patient ID: Vanessa [name], female    DOB: Jul 02, 1945, 66 y.o.   MRN: [id number]  HPI Has gotten sick Started with vomiting last week--but otherwise okay Resolved after 2-3 days  Awoke yesterday with SOB Had to increase the oxygen Ran out of meds for nebulizer so hasn't used  Low grade fever Some shakes---not all new No chills or sweats  Has cough Slight mucus  Current Outpatient Prescriptions on File Prior to Visit  Medication Sig Dispense Refill  . aspirin 81 MG tablet Take 81 mg by mouth daily.        . furosemide (LASIX) 40 MG tablet Take 40 mg by mouth daily as needed.        . metoCLOPramide (REGLAN) 10 MG tablet Take one by mouth 30 minutes before each meal and at bedtime  360 tablet  1  . metoprolol (TOPROL-XL) 100 MG 24 hr tablet Take 1 tablet (100 mg total) by mouth daily.  90 tablet  3  . mineral oil liquid Take one teaspoon at bedtime       . morphine (MS CONTIN) 60 MG 12 hr tablet Take 1 tablet (60 mg total) by mouth 2 (two) times daily. For chronic back pain  60 tablet  0  . morphine (MS CONTIN) 60 MG 12 hr tablet Take 1 tablet (60 mg total) by mouth 2 (two) times daily. For chronic back pain  60 tablet  0  . morphine (MS CONTIN) 60 MG 12 hr tablet Take 1 tablet (60 mg total) by mouth 2 (two) times daily. For chronic back pain  60 tablet  0  . omeprazole (PRILOSEC) 20 MG capsule Take 1 capsule (20 mg total) by mouth 2 (two) times daily.  180 capsule  3  . PARoxetine (PAXIL) 40 MG tablet Take 1 tablet (40 mg total) by mouth daily.  90 tablet  3  . polyethylene glycol (MIRALAX) powder Take one capful with water two times a day       . Respiratory Therapy Supplies (NEBULIZER) DEVI Use as needed       . senna-docusate (SENOKOT S) 8.6-50 MG per tablet Take 2 tablets by mouth 2 (two) times daily.          No Known Allergies  Past Medical History  Diagnosis Date  . Congestive heart failure 05/03    ? diastolic (withSVT)  . COPD (chronic obstructive  pulmonary disease)   . Coronary artery disease   . Depression   . GERD (gastroesophageal reflux disease)   . PVD (peripheral vascular disease)   . Peptic ulcer disease 2008    + H pylori and classic symptoms, antibiotics given, also in 20's  . Chronic back pain   . Chronic constipation   . Tremor   . MI (myocardial infarction)     multiple  . Seizure 2003    vs TIA   . 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 Hospice care     due to severe CHF 2003-probably due to Actos or Avandia    Past Surgical History  Procedure Date  . Cholecystectomy 09/02  . Aorto-femoral bypass graft 1997  . Angioplasty     Multiple  . Tubal ligation   . US echocardiography     EF 77%    Family History  Problem Relation Age of Onset  . Heart disease Mother   . Pulmonary fibrosis Sister   . Cancer Sister     breast  . Diabetes Brother  History   Social History  . Marital Status: Married    Spouse Name: N/A    Number of Children: 2  . Years of Education: N/A   Occupational History  . Retired     Forensic psychologist   Social History Main Topics  . Smoking status: Former Games developer  . Smokeless tobacco: Never Used  . Alcohol Use: No  . Drug Use: No  . Sexually Active: Not on file   Other Topics Concern  . Not on file   Social History Narrative   Patient does not get regular exerciseHad been on hospice due to severe CHF in 2003--probably from actos   Review of Systems No diarrhea Appetite is gone Weight is down a few pounds No rash    Objective:   Physical Exam  Constitutional: No distress.       Looks pale and drawn a little  HENT:  Mouth/Throat: Oropharynx is clear and moist. No oropharyngeal exudate.       No sinus tenderness Mild nasal congestion TMs normal  Neck: Normal range of motion. Neck supple.  Pulmonary/Chest: Effort normal. No respiratory distress. She has wheezes. She has no rales.       Comfortable Mildly prolonged exp phase with exp wheeze and rhonchi Decreased  breath sounds throughout No dullness  Musculoskeletal: She exhibits no edema.  Lymphadenopathy:    She has no cervical adenopathy.          Assessment & Plan:

## 2011-11-05 MED ORDER — ALBUTEROL SULFATE (2.5 MG/3ML) 0.083% IN NEBU: 2.5000 mg | INHALATION_SOLUTION | Freq: Four times a day (QID) | RESPIRATORY_TRACT | Status: DC | PRN

## 2011-11-05 NOTE — Telephone Encounter (Signed)
See if they will approve albuterol 2.5mg  in 3cc saline  1 via neb 4 times per day prn #120 x 1

## 2011-11-05 NOTE — Telephone Encounter (Signed)
Caller: Jerry/Spouse; PCP: [facility] CB#: [phone];  Call regarding Medication  Questions:  Seen on 11/03/11 for Acute Bronchitis and COPD.  Rx Duonebs denied by  insurance.  Caller states breathing no worse than when she was seen, asks if there is alternate medication that can be ordered. Oxygen has been increased to 4 liters for comfort.  Note to provider for review as office open. Caller would like call back when this is done, please.  Thank you.  Pharmacy number listed above. Caller's cell number is  [phone].

## 2011-11-05 NOTE — Telephone Encounter (Signed)
Spoke with patient's husband and advised results. rx sent to pharmacy by e-script  

## 2011-12-11 ENCOUNTER — Encounter: Payer: Self-pay | Admitting: Internal Medicine

## 2011-12-11 ENCOUNTER — Ambulatory Visit (INDEPENDENT_AMBULATORY_CARE_PROVIDER_SITE_OTHER): Payer: Medicare Other | Admitting: Internal Medicine

## 2011-12-11 VITALS — BP 98/60 | HR 79 | Temp 97.7°F | Ht 66.0 in | Wt 125.0 lb

## 2011-12-11 DIAGNOSIS — K219 Gastro-esophageal reflux disease without esophagitis: Secondary | ICD-10-CM

## 2011-12-11 DIAGNOSIS — J449 Chronic obstructive pulmonary disease, unspecified: Secondary | ICD-10-CM

## 2011-12-11 DIAGNOSIS — F329 Major depressive disorder, single episode, unspecified: Secondary | ICD-10-CM

## 2011-12-11 DIAGNOSIS — M549 Dorsalgia, unspecified: Secondary | ICD-10-CM

## 2011-12-11 MED ORDER — MORPHINE SULFATE ER 60 MG PO TBCR: 60.0000 mg | EXTENDED_RELEASE_TABLET | Freq: Two times a day (BID) | ORAL | Status: DC

## 2011-12-11 NOTE — Assessment & Plan Note (Signed)
Mood okay on the med Will continue

## 2011-12-11 NOTE — Assessment & Plan Note (Signed)
Controlled with meds Some degree of gastroparesis probably due to past DM Can try to decrease reglan--is already down to bid

## 2011-12-11 NOTE — Assessment & Plan Note (Signed)
Severe but stable Continue current regimen

## 2011-12-11 NOTE — Progress Notes (Signed)
Subjective:    Patient ID: Vanessa [name], female    DOB: February 02, 1946, 66 y.o.   MRN: [id number]  HPI Here with husband Did lose more weight but then started to feel better Then able to eat better Small, frequent meals  Only a little cough Breathing is better Using the nebulizer regularly and it helps  Back pain still there Satisfied with pain control with the narcotics  No stomach problems Heartburn is controlled Swallows okay  Mood okay Better with improvement in breathing  Current Outpatient Prescriptions on File Prior to Visit  Medication Sig Dispense Refill  . albuterol (PROVENTIL) (2.5 MG/3ML) 0.083% nebulizer solution Take 3 mLs (2.5 mg total) by nebulization 4 (four) times daily as needed.  120 mL  1  . aspirin 81 MG tablet Take 81 mg by mouth daily.        . furosemide (LASIX) 40 MG tablet Take 40 mg by mouth daily as needed.        . metoCLOPramide (REGLAN) 10 MG tablet Take one by mouth 30 minutes before each meal and at bedtime  360 tablet  1  . metoprolol (TOPROL-XL) 100 MG 24 hr tablet Take 1 tablet (100 mg total) by mouth daily.  90 tablet  3  . morphine (MS CONTIN) 60 MG 12 hr tablet Take 1 tablet (60 mg total) by mouth 2 (two) times daily. For chronic back pain  60 tablet  0  . morphine (MS CONTIN) 60 MG 12 hr tablet Take 1 tablet (60 mg total) by mouth 2 (two) times daily. For chronic back pain  60 tablet  0  . morphine (MS CONTIN) 60 MG 12 hr tablet Take 1 tablet (60 mg total) by mouth 2 (two) times daily. For chronic back pain  60 tablet  0  . omeprazole (PRILOSEC) 20 MG capsule Take 1 capsule (20 mg total) by mouth 2 (two) times daily.  180 capsule  3  . PARoxetine (PAXIL) 40 MG tablet Take 1 tablet (40 mg total) by mouth daily.  90 tablet  3  . polyethylene glycol (MIRALAX) powder Take one capful with water two times a day       . Respiratory Therapy Supplies (NEBULIZER) DEVI Use as needed       . senna-docusate (SENOKOT S) 8.6-50 MG per tablet Take 2  tablets by mouth 2 (two) times daily.          No Known Allergies  Past Medical History  Diagnosis Date  . Congestive heart failure 05/03    ? diastolic (withSVT)  . COPD (chronic obstructive pulmonary disease)   . Coronary artery disease   . Depression   . GERD (gastroesophageal reflux disease)   . PVD (peripheral vascular disease)   . Peptic ulcer disease 2008    + H pylori and classic symptoms, antibiotics given, also in 20's  . Chronic back pain   . Chronic constipation   . Tremor   . MI (myocardial infarction)     multiple  . Seizure 2003    vs TIA   . 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 Hospice care     due to severe CHF 2003-probably due to Actos or Avandia    Past Surgical History  Procedure Date  . Cholecystectomy 09/02  . Aorto-femoral bypass graft 1997  . Angioplasty     Multiple  . Tubal ligation   . US echocardiography     EF 77%    Family History  Problem Relation Age  of Onset  . Heart disease Mother   . Pulmonary fibrosis Sister   . Cancer Sister     breast  . Diabetes Brother     History   Social History  . Marital Status: Married    Spouse Name: N/A    Number of Children: 2  . Years of Education: N/A   Occupational History  . Retired     Forensic psychologist   Social History Main Topics  . Smoking status: Former Games developer  . Smokeless tobacco: Never Used  . Alcohol Use: No  . Drug Use: No  . Sexually Active: Not on file   Other Topics Concern  . Not on file   Social History Narrative   Patient does not get regular exerciseHad been on hospice due to severe CHF in 2003--probably from actos   Review of Systems Weight back up 7# fortunately Sleeps a lot---stays sleepy--never really feels refreshed Husband doesn't note apnea     Objective:   Physical Exam  Constitutional: She appears well-developed and well-nourished. No distress.  Neck: Normal range of motion. Neck supple. No thyromegaly present.  Cardiovascular: Normal rate, regular  rhythm, normal heart sounds and intact distal pulses.  Exam reveals no gallop.   No murmur heard. Pulmonary/Chest: Effort normal. No respiratory distress. She has no wheezes. She has no rales.       Decreased breath sounds but clear  Abdominal: Soft. There is no tenderness.  Musculoskeletal: She exhibits no edema and no tenderness.  Lymphadenopathy:    She has no cervical adenopathy.  Psychiatric: She has a normal mood and affect. Her behavior is normal.          Assessment & Plan:

## 2011-12-11 NOTE — Assessment & Plan Note (Signed)
Satisfied with stable morphine dose Will refill

## 2011-12-19 ENCOUNTER — Encounter: Payer: Self-pay | Admitting: Internal Medicine

## 2011-12-19 ENCOUNTER — Telehealth: Payer: Self-pay

## 2011-12-19 DIAGNOSIS — Z0279 Encounter for issue of other medical certificate: Secondary | ICD-10-CM

## 2011-12-19 NOTE — Telephone Encounter (Signed)
Pt needs letter to excuse from [place] [employer]. Please call when letter ready for pick up.

## 2011-12-19 NOTE — Telephone Encounter (Signed)
Patient's husband notified letter ready and of $20 charge.  Letter mailed to patient.

## 2011-12-19 NOTE — Telephone Encounter (Signed)
Letter done $20 charge 

## 2011-12-29 ENCOUNTER — Telehealth: Payer: Self-pay | Admitting: Internal Medicine

## 2011-12-29 NOTE — Telephone Encounter (Signed)
Caller: Jerry/Patient; PCP: [facility]; CB#: [phone]; ; ; Call regarding Nausea; Onset- 12/26/11 Pt c/o of nausea after eating meals. States she has chronic problems with this and doctor is aware. Emergent s/s of Nausea or Vomiting protocol r/o. Pt to see provider within 72hrs. State they can not come into office. They would like provider recommendation on medicines to help.

## 2011-12-29 NOTE — Telephone Encounter (Signed)
The best thing for this is very small meals that are frequent The only med for this is usually reglan which she is already on  Make sure she is not having acid reflux issues with heartburn or regurgitation

## 2011-12-30 NOTE — Telephone Encounter (Signed)
Please send Rx for ondansetron 4mg  tid prn for nausea #30 x 0 Hopefully this may help her nausea some

## 2011-12-30 NOTE — Telephone Encounter (Signed)
Patient says she is so nauseated that even the thoughts of food makes her sick.  She is not able to eat anything.  She has lost weight back down to 118 lbs.  She says she will try to do the best she can.

## 2011-12-30 NOTE — Telephone Encounter (Signed)
Left message asking pt to call back. 

## 2011-12-31 NOTE — Telephone Encounter (Signed)
Rx called in as instructed 

## 2012-01-05 NOTE — Telephone Encounter (Signed)
Spoke with husband and advised that rx was sent to pharmacy and rx needs prior auth and I will get the forms for prior auth. Per husband he didn't know anything was called in, he also states pt is feeling better and thinks she maybe had a virus, he does want to proceed with prior auth.

## 2012-01-05 NOTE — Telephone Encounter (Signed)
Dr.Copland can you please feel out prior auth form, its on your desk. Thanks

## 2012-01-05 NOTE — Telephone Encounter (Signed)
completed

## 2012-03-11 ENCOUNTER — Ambulatory Visit (INDEPENDENT_AMBULATORY_CARE_PROVIDER_SITE_OTHER)
Admission: RE | Admit: 2012-03-11 | Discharge: 2012-03-11 | Disposition: A | Discharge status: Home / Self Care | Payer: Medicare Other | Source: Ambulatory Visit | Attending: Internal Medicine | Admitting: Internal Medicine

## 2012-03-11 ENCOUNTER — Other Ambulatory Visit: Payer: Self-pay

## 2012-03-11 ENCOUNTER — Encounter: Payer: Self-pay | Admitting: Internal Medicine

## 2012-03-11 ENCOUNTER — Ambulatory Visit (INDEPENDENT_AMBULATORY_CARE_PROVIDER_SITE_OTHER): Payer: Medicare Other | Admitting: Internal Medicine

## 2012-03-11 VITALS — BP 102/68 | HR 66 | Temp 97.0°F | Ht 66.0 in | Wt 115.0 lb

## 2012-03-11 DIAGNOSIS — I2510 Atherosclerotic heart disease of native coronary artery without angina pectoris: Secondary | ICD-10-CM

## 2012-03-11 DIAGNOSIS — J4489 Other specified chronic obstructive pulmonary disease: Secondary | ICD-10-CM

## 2012-03-11 DIAGNOSIS — R634 Abnormal weight loss: Secondary | ICD-10-CM | POA: Insufficient documentation

## 2012-03-11 DIAGNOSIS — F329 Major depressive disorder, single episode, unspecified: Secondary | ICD-10-CM

## 2012-03-11 DIAGNOSIS — J449 Chronic obstructive pulmonary disease, unspecified: Secondary | ICD-10-CM

## 2012-03-11 DIAGNOSIS — M549 Dorsalgia, unspecified: Secondary | ICD-10-CM

## 2012-03-11 DIAGNOSIS — Z23 Encounter for immunization: Secondary | ICD-10-CM

## 2012-03-11 MED ORDER — MORPHINE SULFATE ER 60 MG PO TBCR: 60.0000 mg | EXTENDED_RELEASE_TABLET | Freq: Two times a day (BID) | ORAL | Status: DC

## 2012-03-11 MED ORDER — MIRTAZAPINE 15 MG PO TABS: 15.0000 mg | ORAL_TABLET | Freq: Every day | ORAL | Status: DC

## 2012-03-11 NOTE — Assessment & Plan Note (Signed)
CXR looks okay Some flank pain but doesn't clearly have pathology of concern No changes in regimen

## 2012-03-11 NOTE — Assessment & Plan Note (Signed)
Mood is okay Continue the paroxetine

## 2012-03-11 NOTE — Assessment & Plan Note (Signed)
Not clear why No nausea now Will try mirtazapine 15mg  daily

## 2012-03-11 NOTE — Assessment & Plan Note (Signed)
Doing okay on the morphine Stable dose for years

## 2012-03-11 NOTE — Assessment & Plan Note (Signed)
No obvious symptoms 

## 2012-03-11 NOTE — Progress Notes (Signed)
Subjective:    Patient ID: Vanessa [name], female    DOB: [birth year], 66 y.o.   MRN: [id number]  HPI "I don't feel that well" Has had some RUQ/flank pain at times and some nausea--she wondered about pleurisy Not much cough Breathing is okay---DOE but doesn't do much Does very little housework No fever  Some heartburn Takes omeprazole daily No trouble swallowing No water brash  Back pain seems controlled Satisfied with the morphine--no obvious side effects  No other chest pain No edema  Mood has been generally okay [place]---really enjoyed it  Current Outpatient Prescriptions on File Prior to Visit  Medication Sig Dispense Refill  . albuterol (PROVENTIL) (2.5 MG/3ML) 0.083% nebulizer solution Take 3 mLs (2.5 mg total) by nebulization 4 (four) times daily as needed.  120 mL  1  . aspirin 81 MG tablet Take 81 mg by mouth daily.        . furosemide (LASIX) 40 MG tablet Take 40 mg by mouth daily as needed.        . metoprolol (TOPROL-XL) 100 MG 24 hr tablet Take 1 tablet (100 mg total) by mouth daily.  90 tablet  3  . morphine (MS CONTIN) 60 MG 12 hr tablet Take 1 tablet (60 mg total) by mouth 2 (two) times daily.  60 tablet  0  . morphine (MS CONTIN) 60 MG 12 hr tablet Take 1 tablet (60 mg total) by mouth 2 (two) times daily.  60 tablet  0  . morphine (MS CONTIN) 60 MG 12 hr tablet Take 1 tablet (60 mg total) by mouth 2 (two) times daily.  60 tablet  0  . omeprazole (PRILOSEC) 20 MG capsule Take 1 capsule (20 mg total) by mouth 2 (two) times daily.  180 capsule  3  . ondansetron (ZOFRAN) 4 MG tablet Take 4 mg by mouth 3 (three) times daily as needed.      Marland Kitchen PARoxetine (PAXIL) 40 MG tablet Take 1 tablet (40 mg total) by mouth daily.  90 tablet  3  . polyethylene glycol (MIRALAX) powder Take one capful with water two times a day       . Respiratory Therapy Supplies (NEBULIZER) DEVI Use as needed       . senna-docusate (SENOKOT S) 8.6-50 MG per tablet Take 2  tablets by mouth 2 (two) times daily.          No Known Allergies  Past Medical History  Diagnosis Date  . Congestive heart failure 05/03    ? diastolic (withSVT)  . COPD (chronic obstructive pulmonary disease)   . Coronary artery disease   . Depression   . GERD (gastroesophageal reflux disease)   . PVD (peripheral vascular disease)   . Peptic ulcer disease 2008    + H pylori and classic symptoms, antibiotics given, also in 20's  . Chronic back pain   . Chronic constipation   . Tremor   . MI (myocardial infarction)     multiple  . Seizure 2003    vs TIA   . 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 Hospice care     due to severe CHF 2003-probably due to Actos or Avandia    Past Surgical History  Procedure Date  . Cholecystectomy 09/02  . Aorto-femoral bypass graft 1997  . Angioplasty     Multiple  . Tubal ligation   . US echocardiography     EF 77%    Family History  Problem Relation Age  of Onset  . Heart disease Mother   . Pulmonary fibrosis Sister   . Cancer Sister     breast  . Diabetes Brother     History   Social History  . Marital Status: Married    Spouse Name: N/A    Number of Children: 2  . Years of Education: N/A   Occupational History  . Retired     Forensic psychologist   Social History Main Topics  . Smoking status: Former Games developer  . Smokeless tobacco: Never Used  . Alcohol Use: No  . Drug Use: No  . Sexually Active: Not on file   Other Topics Concern  . Not on file   Social History Narrative   Patient does not get regular exerciseHad been on hospice due to severe CHF in 2003--probably from actos   Review of Systems     Objective:   Physical Exam  Constitutional: No distress.       Visible weight loss--some wasting  Neck: Normal range of motion. Neck supple.  Cardiovascular: Normal rate, regular rhythm and normal heart sounds.  Exam reveals no gallop.   No murmur heard. Pulmonary/Chest: Effort normal. No respiratory distress. She has no  wheezes. She has no rales. She exhibits no tenderness.       Decreased breath sounds but clear No rib tenderness  Abdominal: Soft. There is no tenderness.  Musculoskeletal: She exhibits no edema and no tenderness.  Lymphadenopathy:    She has no cervical adenopathy.  Psychiatric: She has a normal mood and affect. Her behavior is normal.          Assessment & Plan:

## 2012-03-11 NOTE — Patient Instructions (Signed)
Please start the mirtazapine at bedtime. Call if you have any problems with this new med

## 2012-03-11 NOTE — Addendum Note (Signed)
Addended by: Sueanne Margarita on: 03/11/2012 03:18 PM   Modules accepted: Orders

## 2012-06-11 ENCOUNTER — Ambulatory Visit (INDEPENDENT_AMBULATORY_CARE_PROVIDER_SITE_OTHER): Payer: Medicare Other | Admitting: Internal Medicine

## 2012-06-11 ENCOUNTER — Encounter: Payer: Self-pay | Admitting: Internal Medicine

## 2012-06-11 VITALS — BP 108/62 | HR 63 | Temp 97.6°F | Ht 66.0 in | Wt 118.0 lb

## 2012-06-11 DIAGNOSIS — J4489 Other specified chronic obstructive pulmonary disease: Secondary | ICD-10-CM

## 2012-06-11 DIAGNOSIS — R634 Abnormal weight loss: Secondary | ICD-10-CM

## 2012-06-11 DIAGNOSIS — J449 Chronic obstructive pulmonary disease, unspecified: Secondary | ICD-10-CM

## 2012-06-11 DIAGNOSIS — F3289 Other specified depressive episodes: Secondary | ICD-10-CM

## 2012-06-11 DIAGNOSIS — F329 Major depressive disorder, single episode, unspecified: Secondary | ICD-10-CM

## 2012-06-11 DIAGNOSIS — M549 Dorsalgia, unspecified: Secondary | ICD-10-CM

## 2012-06-11 MED ORDER — FUROSEMIDE 40 MG PO TABS: 40.0000 mg | ORAL_TABLET | Freq: Every day | ORAL | Status: DC | PRN

## 2012-06-11 MED ORDER — MORPHINE SULFATE ER 60 MG PO TBCR: 60.0000 mg | EXTENDED_RELEASE_TABLET | Freq: Two times a day (BID) | ORAL | Status: DC

## 2012-06-11 MED ORDER — METOPROLOL SUCCINATE ER 100 MG PO TB24: 100.0000 mg | ORAL_TABLET | Freq: Every day | ORAL | Status: DC

## 2012-06-11 MED ORDER — MIRTAZAPINE 15 MG PO TABS: 15.0000 mg | ORAL_TABLET | Freq: Every day | ORAL | Status: DC

## 2012-06-11 MED ORDER — PAROXETINE HCL 40 MG PO TABS: 40.0000 mg | ORAL_TABLET | Freq: Every day | ORAL | Status: DC

## 2012-06-11 MED ORDER — OMEPRAZOLE 20 MG PO CPDR: 20.0000 mg | DELAYED_RELEASE_CAPSULE | Freq: Two times a day (BID) | ORAL | Status: DC

## 2012-06-11 NOTE — Progress Notes (Signed)
Subjective:    Patient ID: Vanessa [name], female    DOB: May 02, 1946, 67 y.o.   MRN: [id number]  HPI Doing okay Not sure the mirtazapine has helped May be eating some better Sleeps okay regardless of taking it  Satisfied with her pain regimen Hasn't been out walking due to the weather  Breathing is okay No sig cough No fever or illness No chest pain Has had some ankle edema---noted it after trip to the mountains Discussed using support hose when she travels  Mood has been okay No problems with depression Current Outpatient Prescriptions on File Prior to Visit  Medication Sig Dispense Refill  . albuterol (PROVENTIL) (2.5 MG/3ML) 0.083% nebulizer solution Take 3 mLs (2.5 mg total) by nebulization 4 (four) times daily as needed.  120 mL  1  . aspirin 81 MG tablet Take 81 mg by mouth daily.        . furosemide (LASIX) 40 MG tablet Take 1 tablet (40 mg total) by mouth daily as needed.  90 tablet  3  . metoprolol succinate (TOPROL-XL) 100 MG 24 hr tablet Take 1 tablet (100 mg total) by mouth daily.  90 tablet  3  . mirtazapine (REMERON) 15 MG tablet Take 1 tablet (15 mg total) by mouth at bedtime.  90 tablet  3  . omeprazole (PRILOSEC) 20 MG capsule Take 1 capsule (20 mg total) by mouth 2 (two) times daily.  180 capsule  3  . ondansetron (ZOFRAN) 4 MG tablet Take 4 mg by mouth 3 (three) times daily as needed.      Marland Kitchen PARoxetine (PAXIL) 40 MG tablet Take 1 tablet (40 mg total) by mouth daily.  90 tablet  3  . polyethylene glycol (MIRALAX) powder Take one capful with water two times a day       . Respiratory Therapy Supplies (NEBULIZER) DEVI Use as needed       . senna-docusate (SENOKOT S) 8.6-50 MG per tablet Take 2 tablets by mouth 2 (two) times daily.          No Known Allergies  Past Medical History  Diagnosis Date  . Congestive heart failure 05/03    ? diastolic (withSVT)  . COPD (chronic obstructive pulmonary disease)   . Coronary artery disease   . Depression   . GERD  (gastroesophageal reflux disease)   . PVD (peripheral vascular disease)   . Peptic ulcer disease 2008    + H pylori and classic symptoms, antibiotics given, also in 20's  . Chronic back pain   . Chronic constipation   . Tremor   . MI (myocardial infarction)     multiple  . Seizure 2003    vs TIA   . 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 Hospice care     due to severe CHF 2003-probably due to Actos or Avandia    Past Surgical History  Procedure Date  . Cholecystectomy 09/02  . Aorto-femoral bypass graft 1997  . Angioplasty     Multiple  . Tubal ligation   . US echocardiography     EF 77%    Family History  Problem Relation Age of Onset  . Heart disease Mother   . Pulmonary fibrosis Sister   . Cancer Sister     breast  . Diabetes Brother     History   Social History  . Marital Status: Married    Spouse Name: N/A    Number of Children: 2  . Years of Education: N/A  Occupational History  . Retired     Forensic psychologist   Social History Main Topics  . Smoking status: Former Games developer  . Smokeless tobacco: Never Used  . Alcohol Use: No  . Drug Use: No  . Sexually Active: Not on file   Other Topics Concern  . Not on file   Social History Narrative   Patient does not get regular exerciseHad been on hospice due to severe CHF in 2003--probably from actos   Review of Systems Bowels have been fine No rashes or suspicious lesions     Objective:   Physical Exam  Constitutional: She appears well-developed. No distress.  Neck: Normal range of motion. Neck supple.  Cardiovascular: Normal rate, regular rhythm and normal heart sounds.  Exam reveals no gallop.   No murmur heard.      Subxiphoid heart sounds  Pulmonary/Chest: Effort normal. No respiratory distress. She has no wheezes. She has no rales.       Decreased breath sounds but clear  Abdominal: Soft. There is no tenderness.  Musculoskeletal: She exhibits no edema and no tenderness.  Lymphadenopathy:    She has no  cervical adenopathy.  Psychiatric: She has a normal mood and affect. Her behavior is normal.          Assessment & Plan:

## 2012-06-11 NOTE — Assessment & Plan Note (Signed)
Breathing has been stable No changes needed

## 2012-06-11 NOTE — Assessment & Plan Note (Signed)
Has gained 3# Not sure the mirtazapine is helping but will continue

## 2012-06-11 NOTE — Assessment & Plan Note (Signed)
Mood has been fine Will continue the paroxetine

## 2012-06-11 NOTE — Assessment & Plan Note (Signed)
Okay with stable morphine dosage

## 2012-09-09 ENCOUNTER — Ambulatory Visit (INDEPENDENT_AMBULATORY_CARE_PROVIDER_SITE_OTHER): Payer: Medicare Other | Admitting: Internal Medicine

## 2012-09-09 ENCOUNTER — Encounter: Payer: Self-pay | Admitting: Internal Medicine

## 2012-09-09 VITALS — BP 110/70 | HR 84 | Temp 98.2°F | Wt 122.0 lb

## 2012-09-09 DIAGNOSIS — J4489 Other specified chronic obstructive pulmonary disease: Secondary | ICD-10-CM

## 2012-09-09 DIAGNOSIS — Z23 Encounter for immunization: Secondary | ICD-10-CM

## 2012-09-09 DIAGNOSIS — J449 Chronic obstructive pulmonary disease, unspecified: Secondary | ICD-10-CM

## 2012-09-09 DIAGNOSIS — F329 Major depressive disorder, single episode, unspecified: Secondary | ICD-10-CM

## 2012-09-09 DIAGNOSIS — M549 Dorsalgia, unspecified: Secondary | ICD-10-CM

## 2012-09-09 DIAGNOSIS — K219 Gastro-esophageal reflux disease without esophagitis: Secondary | ICD-10-CM

## 2012-09-09 DIAGNOSIS — F3289 Other specified depressive episodes: Secondary | ICD-10-CM

## 2012-09-09 DIAGNOSIS — I2510 Atherosclerotic heart disease of native coronary artery without angina pectoris: Secondary | ICD-10-CM

## 2012-09-09 MED ORDER — MORPHINE SULFATE ER 60 MG PO TBCR: 60.0000 mg | EXTENDED_RELEASE_TABLET | Freq: Two times a day (BID) | ORAL | Status: DC

## 2012-09-09 NOTE — Assessment & Plan Note (Signed)
Stays quiet on the PPI Will continue

## 2012-09-09 NOTE — Progress Notes (Signed)
Subjective:    Patient ID: Vanessa [name], female    DOB: 10-Sep-1945, 67 y.o.   MRN: [id number]  HPI Doing okay Really trying to eat despite no appetite Weight up 4#  Sleeps well Depression has been controlled  No anxiety  Still satisfied with the analgesic regimen Bowels have been okay  No chest pain No SOB No edema Occ awakens SOB---mostly related to pulling off her CPAP  No regular cough Not able to do housework but will occasional cook Can sweep briefly without dyspnea  Current Outpatient Prescriptions on File Prior to Visit  Medication Sig Dispense Refill  . albuterol (PROVENTIL) (2.5 MG/3ML) 0.083% nebulizer solution Take 3 mLs (2.5 mg total) by nebulization 4 (four) times daily as needed.  120 mL  1  . aspirin 81 MG tablet Take 81 mg by mouth daily.        . furosemide (LASIX) 40 MG tablet Take 1 tablet (40 mg total) by mouth daily as needed.  90 tablet  3  . metoprolol succinate (TOPROL-XL) 100 MG 24 hr tablet Take 1 tablet (100 mg total) by mouth daily.  90 tablet  3  . mirtazapine (REMERON) 15 MG tablet Take 1 tablet (15 mg total) by mouth at bedtime.  90 tablet  3  . omeprazole (PRILOSEC) 20 MG capsule Take 1 capsule (20 mg total) by mouth 2 (two) times daily.  180 capsule  3  . ondansetron (ZOFRAN) 4 MG tablet Take 4 mg by mouth 3 (three) times daily as needed.      Marland Kitchen PARoxetine (PAXIL) 40 MG tablet Take 1 tablet (40 mg total) by mouth daily.  90 tablet  3  . polyethylene glycol (MIRALAX) powder Take one capful with water two times a day       . Respiratory Therapy Supplies (NEBULIZER) DEVI Use as needed       . senna-docusate (SENOKOT S) 8.6-50 MG per tablet Take 2 tablets by mouth 2 (two) times daily.         No current facility-administered medications on file prior to visit.    No Known Allergies  Past Medical History  Diagnosis Date  . Congestive heart failure 05/03    ? diastolic (withSVT)  . COPD (chronic obstructive pulmonary disease)   .  Coronary artery disease   . Depression   . GERD (gastroesophageal reflux disease)   . PVD (peripheral vascular disease)   . Peptic ulcer disease 2008    + H pylori and classic symptoms, antibiotics given, also in 20's  . Chronic back pain   . Chronic constipation   . Tremor   . MI (myocardial infarction)     multiple  . Seizure 2003    vs TIA   . 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 Hospice care     due to severe CHF 2003-probably due to Actos or Avandia    Past Surgical History  Procedure Laterality Date  . Cholecystectomy  09/02  . Aorto-femoral bypass graft  1997  . Angioplasty      Multiple  . Tubal ligation    . US echocardiography      EF 77%    Family History  Problem Relation Age of Onset  . Heart disease Mother   . Pulmonary fibrosis Sister   . Cancer Sister     breast  . Diabetes Brother     History   Social History  . Marital Status: Married    Spouse Name: N/A  Number of Children: 2  . Years of Education: N/A   Occupational History  . Retired     Forensic psychologist   Social History Main Topics  . Smoking status: Former Games developer  . Smokeless tobacco: Never Used  . Alcohol Use: No  . Drug Use: No  . Sexually Active: Not on file   Other Topics Concern  . Not on file   Social History Narrative   Patient does not get regular exercise   Had been on hospice due to severe CHF in 2003--probably from actos   Review of Systems No urinary problems No rash or skin problems     Objective:   Physical Exam  Constitutional: She appears well-developed. No distress.  Neck: Normal range of motion. Neck supple. No thyromegaly present.  Cardiovascular: Normal rate, regular rhythm and normal heart sounds.  Exam reveals no gallop.   No murmur heard. Pulmonary/Chest: Effort normal. No respiratory distress. She has no wheezes. She has no rales.  Slightly decreased breath sounds but clear  Abdominal: Soft. There is no tenderness.  Musculoskeletal: She exhibits no  edema and no tenderness.  Lymphadenopathy:    She has no cervical adenopathy.  Psychiatric: She has a normal mood and affect. Her behavior is normal.          Assessment & Plan:

## 2012-09-09 NOTE — Addendum Note (Signed)
Addended by: Sueanne Margarita on: 09/09/2012 04:52 PM   Modules accepted: Orders

## 2012-09-09 NOTE — Assessment & Plan Note (Signed)
Mood still fine Continue meds---and mirtazapine for appetite

## 2012-09-09 NOTE — Assessment & Plan Note (Signed)
Satisfied with stable morphine dose No constipation on the med

## 2012-09-09 NOTE — Assessment & Plan Note (Signed)
Stable respiratory status Very limited but no decline No exacerbations in some time

## 2012-09-09 NOTE — Assessment & Plan Note (Signed)
No apparent symptoms 

## 2012-09-10 LAB — HEPATIC FUNCTION PANEL
Bilirubin, Direct: 0.2 mg/dL (ref 0.0–0.3)
Total Bilirubin: 0.5 mg/dL (ref 0.3–1.2)
Total Protein: 7.2 g/dL (ref 6.0–8.3)

## 2012-09-10 LAB — BASIC METABOLIC PANEL
BUN: 10 mg/dL (ref 6–23)
CO2: 31 mEq/L (ref 19–32)
Calcium: 8.9 mg/dL (ref 8.4–10.5)
Creatinine, Ser: 0.6 mg/dL (ref 0.4–1.2)
GFR: 102.48 mL/min (ref >60.00)
Glucose, Bld: 84 mg/dL (ref 70–99)

## 2012-09-10 LAB — CBC WITH DIFFERENTIAL/PLATELET
Basophils Absolute: 0.1 10*3/uL (ref 0.0–0.1)
Eosinophils Absolute: 0.1 10*3/uL (ref 0.0–0.7)
Lymphocytes Relative: 30.1 % (ref 12.0–46.0)
MCHC: 33.6 g/dL (ref 30.0–36.0)
Neutro Abs: 3.3 10*3/uL (ref 1.4–7.7)
Neutrophils Relative %: 60.5 % (ref 43.0–77.0)
Platelets: 141 10*3/uL — ABNORMAL LOW (ref 150.0–400.0)
RDW: 13 % (ref 11.5–14.6)

## 2012-09-10 LAB — TSH: TSH: 1.71 u[IU]/mL (ref 0.35–5.50)

## 2012-09-14 ENCOUNTER — Encounter: Payer: Self-pay | Admitting: *Deleted

## 2012-10-05 ENCOUNTER — Other Ambulatory Visit: Payer: Self-pay | Admitting: Internal Medicine

## 2012-12-09 ENCOUNTER — Encounter: Payer: Self-pay | Admitting: *Deleted

## 2012-12-09 ENCOUNTER — Ambulatory Visit (INDEPENDENT_AMBULATORY_CARE_PROVIDER_SITE_OTHER): Payer: Medicare Other | Admitting: Internal Medicine

## 2012-12-09 ENCOUNTER — Encounter: Payer: Self-pay | Admitting: Internal Medicine

## 2012-12-09 VITALS — BP 110/60 | HR 71 | Temp 98.1°F | Wt 122.0 lb

## 2012-12-09 DIAGNOSIS — M549 Dorsalgia, unspecified: Secondary | ICD-10-CM

## 2012-12-09 DIAGNOSIS — I2510 Atherosclerotic heart disease of native coronary artery without angina pectoris: Secondary | ICD-10-CM

## 2012-12-09 DIAGNOSIS — F329 Major depressive disorder, single episode, unspecified: Secondary | ICD-10-CM

## 2012-12-09 DIAGNOSIS — J449 Chronic obstructive pulmonary disease, unspecified: Secondary | ICD-10-CM

## 2012-12-09 MED ORDER — MORPHINE SULFATE ER 60 MG PO TBCR: 60.0000 mg | EXTENDED_RELEASE_TABLET | Freq: Two times a day (BID) | ORAL | Status: DC

## 2012-12-09 NOTE — Assessment & Plan Note (Signed)
Has been quiet since no diabetes and weight loss

## 2012-12-09 NOTE — Assessment & Plan Note (Signed)
Mood has been good Continue the same meds

## 2012-12-09 NOTE — Progress Notes (Signed)
Subjective:    Patient ID: Vanessa [name], female    DOB: [birth year], 67 y.o.   MRN: [id number]  HPI Doing okay Continues to try to keep up with eating Appetite still not great but pushes herself  Still has the back pain Satisfied with the analgesic regimen  Breathing is affected by the heat Has to stay inside more in air conditioning No major cough-- just a little  Mood has been good No persistent depression  Current Outpatient Prescriptions on File Prior to Visit  Medication Sig Dispense Refill  . albuterol (PROVENTIL) (2.5 MG/3ML) 0.083% nebulizer solution Take 3 mLs (2.5 mg total) by nebulization 4 (four) times daily as needed.  120 mL  1  . aspirin 81 MG tablet Take 81 mg by mouth daily.        . furosemide (LASIX) 40 MG tablet Take 1 tablet (40 mg total) by mouth daily as needed.  90 tablet  3  . metoprolol succinate (TOPROL-XL) 100 MG 24 hr tablet Take 1 tablet (100 mg total) by mouth daily.  90 tablet  3  . mirtazapine (REMERON) 15 MG tablet Take 1 tablet (15 mg total) by mouth at bedtime.  90 tablet  3  . omeprazole (PRILOSEC) 20 MG capsule Take 1 capsule (20 mg total) by mouth 2 (two) times daily.  180 capsule  3  . ondansetron (ZOFRAN) 4 MG tablet TAKE 1 TABLET BY MOUTH THREE TIMES DAILY AS NEEDED FOR NAUSEA  30 tablet  0  . PARoxetine (PAXIL) 40 MG tablet Take 1 tablet (40 mg total) by mouth daily.  90 tablet  3  . polyethylene glycol (MIRALAX) powder Take one capful with water two times a day       . senna-docusate (SENOKOT S) 8.6-50 MG per tablet Take 2 tablets by mouth 2 (two) times daily.         No current facility-administered medications on file prior to visit.    No Known Allergies  Past Medical History  Diagnosis Date  . Congestive heart failure 05/03    ? diastolic (withSVT)  . COPD (chronic obstructive pulmonary disease)   . Coronary artery disease   . Depression   . GERD (gastroesophageal reflux disease)   . PVD (peripheral vascular disease)    . Peptic ulcer disease 2008    + H pylori and classic symptoms, antibiotics given, also in 20's  . Chronic back pain   . Chronic constipation   . Tremor   . MI (myocardial infarction)     multiple  . Seizure 2003    vs TIA   . 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 Hospice care     due to severe CHF 2003-probably due to Actos or Avandia    Past Surgical History  Procedure Laterality Date  . Cholecystectomy  09/02  . Aorto-femoral bypass graft  1997  . Angioplasty      Multiple  . Tubal ligation    . US echocardiography      EF 77%    Family History  Problem Relation Age of Onset  . Heart disease Mother   . Pulmonary fibrosis Sister   . Cancer Sister     breast  . Diabetes Brother     History   Social History  . Marital Status: Married    Spouse Name: N/A    Number of Children: 2  . Years of Education: N/A   Occupational History  . Retired     Forensic psychologist  Social History Main Topics  . Smoking status: Former Games developer  . Smokeless tobacco: Never Used  . Alcohol Use: No  . Drug Use: No  . Sexually Active: Not on file   Other Topics Concern  . Not on file   Social History Narrative   Patient does not get regular exercise   Had been on hospice due to severe CHF in 2003--probably from actos   Review of Systems Sleeping well Bowels are fine    Objective:   Physical Exam  Constitutional: She appears well-developed and well-nourished. No distress.  Neck: Normal range of motion. Neck supple. No thyromegaly present.  Cardiovascular: Normal rate, regular rhythm and normal heart sounds.  Exam reveals no gallop.   No murmur heard. Pulmonary/Chest: Effort normal. No respiratory distress. She has no wheezes. She has no rales.  Decreased breath sounds but clear  Abdominal: Soft. There is no tenderness.  Musculoskeletal: She exhibits no edema and no tenderness.  Lymphadenopathy:    She has no cervical adenopathy.  Psychiatric: She has a normal mood and affect. Her  behavior is normal.          Assessment & Plan:

## 2012-12-09 NOTE — Assessment & Plan Note (Signed)
Fairly severe but stable status No changes

## 2012-12-09 NOTE — Assessment & Plan Note (Signed)
Satisfied with the morphine No change needed

## 2012-12-22 ENCOUNTER — Encounter: Payer: Self-pay | Admitting: Internal Medicine

## 2013-03-10 ENCOUNTER — Encounter: Payer: Self-pay | Admitting: Internal Medicine

## 2013-03-10 ENCOUNTER — Ambulatory Visit (INDEPENDENT_AMBULATORY_CARE_PROVIDER_SITE_OTHER): Payer: Medicare Other | Admitting: Internal Medicine

## 2013-03-10 VITALS — BP 110/60 | HR 90 | Temp 98.2°F | Wt 119.0 lb

## 2013-03-10 DIAGNOSIS — Z23 Encounter for immunization: Secondary | ICD-10-CM

## 2013-03-10 DIAGNOSIS — J449 Chronic obstructive pulmonary disease, unspecified: Secondary | ICD-10-CM

## 2013-03-10 DIAGNOSIS — K219 Gastro-esophageal reflux disease without esophagitis: Secondary | ICD-10-CM

## 2013-03-10 DIAGNOSIS — F329 Major depressive disorder, single episode, unspecified: Secondary | ICD-10-CM

## 2013-03-10 DIAGNOSIS — M549 Dorsalgia, unspecified: Secondary | ICD-10-CM

## 2013-03-10 MED ORDER — MORPHINE SULFATE ER 60 MG PO TBCR: 60.0000 mg | EXTENDED_RELEASE_TABLET | Freq: Two times a day (BID) | ORAL | Status: DC

## 2013-03-10 MED ORDER — MORPHINE SULFATE ER 60 MG PO TBCR
60.0000 mg | EXTENDED_RELEASE_TABLET | Freq: Two times a day (BID) | ORAL | Status: DC
Start: 2013-03-10 — End: 2013-06-10

## 2013-03-10 NOTE — Assessment & Plan Note (Signed)
Satisfied with the morphine refilled

## 2013-03-10 NOTE — Progress Notes (Signed)
Subjective:    Patient ID: Vanessa [name], female    DOB: 10/28/1945, 67 y.o.   MRN: [id number]  HPI Doing well Just back from vacation at the beach Had a good time  Breathing has been fine No serious problems---even with cooler air and mold Uses the albuterol prn (usually once a week at most)  Heartburn controlled Takes the prilosec daily  Satisfied with the morphine for her back pain  Hasn't needed extra meds  Mood is fine Chronic depression since her 20's She prefers not to wean  Current Outpatient Prescriptions on File Prior to Visit  Medication Sig Dispense Refill  . albuterol (PROVENTIL) (2.5 MG/3ML) 0.083% nebulizer solution Take 3 mLs (2.5 mg total) by nebulization 4 (four) times daily as needed.  120 mL  1  . aspirin 81 MG tablet Take 81 mg by mouth daily.        . furosemide (LASIX) 40 MG tablet Take 1 tablet (40 mg total) by mouth daily as needed.  90 tablet  3  . metoprolol succinate (TOPROL-XL) 100 MG 24 hr tablet Take 1 tablet (100 mg total) by mouth daily.  90 tablet  3  . mirtazapine (REMERON) 15 MG tablet Take 1 tablet (15 mg total) by mouth at bedtime.  90 tablet  3  . omeprazole (PRILOSEC) 20 MG capsule Take 1 capsule (20 mg total) by mouth 2 (two) times daily.  180 capsule  3  . ondansetron (ZOFRAN) 4 MG tablet TAKE 1 TABLET BY MOUTH THREE TIMES DAILY AS NEEDED FOR NAUSEA  30 tablet  0  . PARoxetine (PAXIL) 40 MG tablet Take 1 tablet (40 mg total) by mouth daily.  90 tablet  3  . polyethylene glycol (MIRALAX) powder Take one capful with water two times a day       . senna-docusate (SENOKOT S) 8.6-50 MG per tablet Take 2 tablets by mouth 2 (two) times daily.         No current facility-administered medications on file prior to visit.    No Known Allergies  Past Medical History  Diagnosis Date  . Congestive heart failure 05/03    ? diastolic (withSVT)  . COPD (chronic obstructive pulmonary disease)   . Coronary artery disease   . Depression   .  GERD (gastroesophageal reflux disease)   . PVD (peripheral vascular disease)   . Peptic ulcer disease 2008    + H pylori and classic symptoms, antibiotics given, also in 20's  . Chronic back pain   . Chronic constipation   . Tremor   . MI (myocardial infarction)     multiple  . Seizure 2003    vs TIA   . 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 Hospice care     due to severe CHF 2003-probably due to Actos or Avandia    Past Surgical History  Procedure Laterality Date  . Cholecystectomy  09/02  . Aorto-femoral bypass graft  1997  . Angioplasty      Multiple  . Tubal ligation    . US echocardiography      EF 77%    Family History  Problem Relation Age of Onset  . Heart disease Mother   . Pulmonary fibrosis Sister   . Cancer Sister     breast  . Diabetes Brother     History   Social History  . Marital Status: Married    Spouse Name: N/A    Number of Children: 2  . Years of Education: N/A  Occupational History  . Retired     Forensic psychologist   Social History Main Topics  . Smoking status: Former Games developer  . Smokeless tobacco: Never Used  . Alcohol Use: No  . Drug Use: No  . Sexual Activity: Not on file   Other Topics Concern  . Not on file   Social History Narrative   Had been on hospice due to severe CHF in 2003--probably from actos      Has living will   Husband has health care POA   Has DNR   No tube feeds if cognitively unaware   Review of Systems Weight is back down a few pounds---has been eating fairly well Sleeps well Bowels are fine    Objective:   Physical Exam  Constitutional: She appears well-developed and well-nourished. No distress.  Neck: Normal range of motion. Neck supple. No thyromegaly present.  Cardiovascular: Normal rate, regular rhythm and normal heart sounds.  Exam reveals no gallop.   No murmur heard. Pulmonary/Chest: Effort normal. No respiratory distress. She has no wheezes. She has no rales.  Decreased breath sounds but clear   Musculoskeletal: She exhibits no edema.  Lymphadenopathy:    She has no cervical adenopathy.  Psychiatric: She has a normal mood and affect. Her behavior is normal.          Assessment & Plan:

## 2013-03-10 NOTE — Assessment & Plan Note (Signed)
Quiet on the med Given her lung disease, will not wean

## 2013-03-10 NOTE — Assessment & Plan Note (Signed)
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

## 2013-03-10 NOTE — Assessment & Plan Note (Signed)
Breathing has been stable No changes needed

## 2013-03-10 NOTE — Addendum Note (Signed)
Addended by: Shon Millet on: 03/10/2013 03:39 PM   Modules accepted: Orders

## 2013-03-11 ENCOUNTER — Telehealth: Payer: Self-pay | Admitting: Family Medicine

## 2013-03-11 NOTE — Telephone Encounter (Signed)
MS Contin RX's placed at front desk and pt notified.

## 2013-06-10 ENCOUNTER — Ambulatory Visit (INDEPENDENT_AMBULATORY_CARE_PROVIDER_SITE_OTHER): Payer: Medicare Other | Admitting: Internal Medicine

## 2013-06-10 ENCOUNTER — Encounter: Payer: Self-pay | Admitting: Internal Medicine

## 2013-06-10 VITALS — BP 108/60 | HR 87 | Temp 97.8°F | Wt 127.0 lb

## 2013-06-10 DIAGNOSIS — K219 Gastro-esophageal reflux disease without esophagitis: Secondary | ICD-10-CM

## 2013-06-10 DIAGNOSIS — F329 Major depressive disorder, single episode, unspecified: Secondary | ICD-10-CM

## 2013-06-10 DIAGNOSIS — J449 Chronic obstructive pulmonary disease, unspecified: Secondary | ICD-10-CM

## 2013-06-10 DIAGNOSIS — F3289 Other specified depressive episodes: Secondary | ICD-10-CM

## 2013-06-10 DIAGNOSIS — M549 Dorsalgia, unspecified: Secondary | ICD-10-CM

## 2013-06-10 DIAGNOSIS — G8929 Other chronic pain: Secondary | ICD-10-CM

## 2013-06-10 DIAGNOSIS — J4489 Other specified chronic obstructive pulmonary disease: Secondary | ICD-10-CM

## 2013-06-10 MED ORDER — METOPROLOL SUCCINATE ER 100 MG PO TB24
100.0000 mg | ORAL_TABLET | Freq: Every day | ORAL | Status: DC
Start: 2013-06-10 — End: 2014-03-06

## 2013-06-10 MED ORDER — OMEPRAZOLE 20 MG PO CPDR: 20.0000 mg | DELAYED_RELEASE_CAPSULE | Freq: Two times a day (BID) | ORAL | Status: DC

## 2013-06-10 MED ORDER — MORPHINE SULFATE ER 60 MG PO TBCR
60.0000 mg | EXTENDED_RELEASE_TABLET | Freq: Two times a day (BID) | ORAL | Status: DC
Start: 2013-06-10 — End: 2013-09-08

## 2013-06-10 MED ORDER — MORPHINE SULFATE ER 60 MG PO TBCR: 60.0000 mg | EXTENDED_RELEASE_TABLET | Freq: Two times a day (BID) | ORAL | Status: DC

## 2013-06-10 MED ORDER — FUROSEMIDE 40 MG PO TABS: 40.0000 mg | ORAL_TABLET | Freq: Every day | ORAL | Status: DC | PRN

## 2013-06-10 MED ORDER — PAROXETINE HCL 40 MG PO TABS: 40.0000 mg | ORAL_TABLET | Freq: Every day | ORAL | Status: DC

## 2013-06-10 NOTE — Assessment & Plan Note (Signed)
Mood is stable Will continue the medication

## 2013-06-10 NOTE — Assessment & Plan Note (Signed)
Quiet on the medication Will continue

## 2013-06-10 NOTE — Progress Notes (Signed)
Pre-visit discussion using our clinic review tool. No additional management support is needed unless otherwise documented below in the visit note.  

## 2013-06-10 NOTE — Assessment & Plan Note (Signed)
Limited by lung disease but no changes

## 2013-06-10 NOTE — Assessment & Plan Note (Signed)
Still satisfied with the morphine No changes

## 2013-06-10 NOTE — Progress Notes (Signed)
Subjective:    Patient ID: Vanessa [name], female    DOB: 01-19-1946, 68 y.o.   MRN: [id number]  HPI Doing well Gained 8#--- has been eating more sweets  Back pain persists Controlled okay with morphine No change in leg strength  Mood has been okay No depressed mood--not anhedonic  Breathing is stable May be some worse in the cold weather No wheezing Some persistent cough--- dry  Current Outpatient Prescriptions on File Prior to Visit  Medication Sig Dispense Refill  . albuterol (PROVENTIL) (2.5 MG/3ML) 0.083% nebulizer solution Take 3 mLs (2.5 mg total) by nebulization 4 (four) times daily as needed.  120 mL  1  . aspirin 81 MG tablet Take 81 mg by mouth daily.        . ondansetron (ZOFRAN) 4 MG tablet TAKE 1 TABLET BY MOUTH THREE TIMES DAILY AS NEEDED FOR NAUSEA  30 tablet  0  . polyethylene glycol (MIRALAX) powder Take one capful with water two times a day       . senna-docusate (SENOKOT S) 8.6-50 MG per tablet Take 2 tablets by mouth 2 (two) times daily.         No current facility-administered medications on file prior to visit.    No Known Allergies  Past Medical History  Diagnosis Date  . Congestive heart failure 05/03    ? diastolic (withSVT)  . COPD (chronic obstructive pulmonary disease)   . Coronary artery disease   . Depression   . GERD (gastroesophageal reflux disease)   . PVD (peripheral vascular disease)   . Peptic ulcer disease 2008    + H pylori and classic symptoms, antibiotics given, also in 20's  . Chronic back pain   . Chronic constipation   . Tremor   . MI (myocardial infarction)     multiple  . Seizure 2003    vs TIA   . 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 Hospice care     due to severe CHF 2003-probably due to Actos or Avandia    Past Surgical History  Procedure Laterality Date  . Cholecystectomy  09/02  . Aorto-femoral bypass graft  1997  . Angioplasty      Multiple  . Tubal ligation    . US echocardiography      EF 77%     Family History  Problem Relation Age of Onset  . Heart disease Mother   . Pulmonary fibrosis Sister   . Cancer Sister     breast  . Diabetes Brother     History   Social History  . Marital Status: Married    Spouse Name: N/A    Number of Children: 2  . Years of Education: N/A   Occupational History  . Retired     Forensic psychologist   Social History Main Topics  . Smoking status: Former Games developer  . Smokeless tobacco: Never Used  . Alcohol Use: No  . Drug Use: No  . Sexual Activity: Not on file   Other Topics Concern  . Not on file   Social History Narrative   Had been on hospice due to severe CHF in 2003--probably from actos      Has living will   Husband has health care POA   Has DNR   No tube feeds if cognitively unaware   Review of Systems Sleeps poorly at night--better in day. Worked 3rd shift for 20 years No incontinence of bowel or bladder No rash or skin problems. Has dry skin  Objective:   Physical Exam  Constitutional: She appears well-developed. No distress.  Neck: Normal range of motion. Neck supple. No thyromegaly present.  Cardiovascular: Normal rate, regular rhythm and normal heart sounds.  Exam reveals no gallop.   No murmur heard. Pulmonary/Chest: Effort normal and breath sounds normal. No respiratory distress. She has no wheezes. She has no rales.  Abdominal: Soft. There is no tenderness.  Musculoskeletal: She exhibits no edema.  Lymphadenopathy:    She has no cervical adenopathy.  Psychiatric: She has a normal mood and affect. Her behavior is normal.          Assessment & Plan:

## 2013-06-14 ENCOUNTER — Telehealth: Payer: Self-pay | Admitting: Internal Medicine

## 2013-06-14 NOTE — Telephone Encounter (Signed)
Relevant patient education mailed to patient.  

## 2013-06-24 ENCOUNTER — Telehealth: Payer: Self-pay

## 2013-06-24 MED ORDER — OMEPRAZOLE 20 MG PO CPDR: 20.0000 mg | DELAYED_RELEASE_CAPSULE | Freq: Every day | ORAL | Status: DC

## 2013-06-24 NOTE — Telephone Encounter (Signed)
[facility] in Dr Karle StarchLetvak's in basket); pt request different med than omeprazole due to ins coverage; end of first pg of [employer] letter, omeprazole 20 mg is covered but has plan quantity limits; 30 capsules allowed in 30 days. Pt is taking 1 cap twice a day at present time.Please advise. Pt request cb.

## 2013-06-24 NOTE — Telephone Encounter (Signed)
Spoke with patient's husband and he stated she only takes it once daily and every now and then twice daily, he requested that I change it back to once daily and resend to the pharmacy. rx sent to pharmacy by e-script

## 2013-09-08 ENCOUNTER — Ambulatory Visit (INDEPENDENT_AMBULATORY_CARE_PROVIDER_SITE_OTHER): Payer: Medicare Other | Admitting: Internal Medicine

## 2013-09-08 ENCOUNTER — Encounter: Payer: Self-pay | Admitting: Radiology

## 2013-09-08 ENCOUNTER — Encounter: Payer: Self-pay | Admitting: Internal Medicine

## 2013-09-08 VITALS — BP 110/70 | HR 72 | Temp 97.6°F | Wt 132.0 lb

## 2013-09-08 DIAGNOSIS — I2510 Atherosclerotic heart disease of native coronary artery without angina pectoris: Secondary | ICD-10-CM

## 2013-09-08 DIAGNOSIS — F329 Major depressive disorder, single episode, unspecified: Secondary | ICD-10-CM

## 2013-09-08 DIAGNOSIS — K219 Gastro-esophageal reflux disease without esophagitis: Secondary | ICD-10-CM

## 2013-09-08 DIAGNOSIS — J449 Chronic obstructive pulmonary disease, unspecified: Secondary | ICD-10-CM

## 2013-09-08 DIAGNOSIS — F3289 Other specified depressive episodes: Secondary | ICD-10-CM

## 2013-09-08 DIAGNOSIS — I739 Peripheral vascular disease, unspecified: Secondary | ICD-10-CM

## 2013-09-08 LAB — CBC WITH DIFFERENTIAL/PLATELET
BASOS PCT: 0.6 % (ref 0.0–3.0)
Basophils Absolute: 0 10*3/uL (ref 0.0–0.1)
EOS PCT: 2.5 % (ref 0.0–5.0)
Eosinophils Absolute: 0.2 10*3/uL (ref 0.0–0.7)
HEMATOCRIT: 41 % (ref 36.0–46.0)
HEMOGLOBIN: 13.8 g/dL (ref 12.0–15.0)
LYMPHS ABS: 2.6 10*3/uL (ref 0.7–4.0)
Lymphocytes Relative: 37.3 % (ref 12.0–46.0)
MCHC: 33.6 g/dL (ref 30.0–36.0)
MCV: 99.5 fl (ref 78.0–100.0)
MONO ABS: 0.6 10*3/uL (ref 0.1–1.0)
Monocytes Relative: 8.8 % (ref 3.0–12.0)
NEUTROS ABS: 3.5 10*3/uL (ref 1.4–7.7)
Neutrophils Relative %: 50.8 % (ref 43.0–77.0)
Platelets: 141 10*3/uL — ABNORMAL LOW (ref 150.0–400.0)
RBC: 4.12 Mil/uL (ref 3.87–5.11)
RDW: 13 % (ref 11.5–14.6)
WBC: 6.9 10*3/uL (ref 4.5–10.5)

## 2013-09-08 LAB — COMPREHENSIVE METABOLIC PANEL
ALBUMIN: 3.6 g/dL (ref 3.5–5.2)
ALT: 6 U/L (ref 0–35)
AST: 16 U/L (ref 0–37)
Alkaline Phosphatase: 58 U/L (ref 39–117)
BUN: 6 mg/dL (ref 6–23)
CALCIUM: 8.7 mg/dL (ref 8.4–10.5)
CHLORIDE: 93 meq/L — AB (ref 96–112)
CO2: 35 mEq/L — ABNORMAL HIGH (ref 19–32)
Creatinine, Ser: 0.6 mg/dL (ref 0.4–1.2)
GFR: 104.1 mL/min (ref >60.00)
Glucose, Bld: 75 mg/dL (ref 70–99)
Potassium: 4.1 mEq/L (ref 3.5–5.1)
Sodium: 135 mEq/L (ref 135–145)
Total Bilirubin: 0.4 mg/dL (ref 0.3–1.2)
Total Protein: 6.8 g/dL (ref 6.0–8.3)

## 2013-09-08 LAB — TSH: TSH: 0.76 u[IU]/mL (ref 0.35–5.50)

## 2013-09-08 LAB — T4, FREE: Free T4: 1.07 ng/dL (ref 0.60–1.60)

## 2013-09-08 MED ORDER — MORPHINE SULFATE ER 60 MG PO TBCR: 60.0000 mg | EXTENDED_RELEASE_TABLET | Freq: Two times a day (BID) | ORAL | Status: DC

## 2013-09-08 NOTE — Patient Instructions (Signed)
Please decrease the paroxetine to 40mg  on odd days, and 20mg  on even days. If you are doing fine after 1-2 months, you can decrease to 20mg  every day.

## 2013-09-08 NOTE — Assessment & Plan Note (Signed)
No current symptoms Limited by breathing --this may be the reason

## 2013-09-08 NOTE — Assessment & Plan Note (Signed)
Doing well with daily PPI

## 2013-09-08 NOTE — Assessment & Plan Note (Signed)
Has been quiet Got very sick with multiple meds in past and was on hospice  Will avoid statins and ACEI (and BP on low side)

## 2013-09-08 NOTE — Progress Notes (Signed)
Subjective:    Patient ID: Vanessa [name], female    DOB: Nov 11, 1945, 68 y.o.   MRN: [id number]  HPI Doing well Has gained more weight--mostly likes eating sweets Face has filled out She feels good  Breathing has been good Able to do more without dyspnea--even can help with some of the housework Only a little dry cough  No chest pain No syncope. Gets mild orthostatic dizziness if she pops up No edema  Back pain persists Gets worse if she tries to do things--like mopping Still satisfied with the meds  No problems with reflux Down to just one omeprazole a day  Current Outpatient Prescriptions on File Prior to Visit  Medication Sig Dispense Refill  . albuterol (PROVENTIL) (2.5 MG/3ML) 0.083% nebulizer solution Take 3 mLs (2.5 mg total) by nebulization 4 (four) times daily as needed.  120 mL  1  . aspirin 81 MG tablet Take 81 mg by mouth daily.        . furosemide (LASIX) 40 MG tablet Take 1 tablet (40 mg total) by mouth daily as needed.  90 tablet  3  . metoprolol succinate (TOPROL-XL) 100 MG 24 hr tablet Take 1 tablet (100 mg total) by mouth daily.  90 tablet  3  . omeprazole (PRILOSEC) 20 MG capsule Take 1 capsule (20 mg total) by mouth daily.  90 capsule  3  . ondansetron (ZOFRAN) 4 MG tablet TAKE 1 TABLET BY MOUTH THREE TIMES DAILY AS NEEDED FOR NAUSEA  30 tablet  0  . PARoxetine (PAXIL) 40 MG tablet Take 1 tablet (40 mg total) by mouth daily.  90 tablet  3  . polyethylene glycol (MIRALAX) powder Take one capful with water two times a day       . senna-docusate (SENOKOT S) 8.6-50 MG per tablet Take 2 tablets by mouth 2 (two) times daily.         No current facility-administered medications on file prior to visit.    No Known Allergies  Past Medical History  Diagnosis Date  . Congestive heart failure 05/03    ? diastolic (withSVT)  . COPD (chronic obstructive pulmonary disease)   . Coronary artery disease   . Depression   . GERD (gastroesophageal reflux disease)    . PVD (peripheral vascular disease)   . Peptic ulcer disease 2008    + H pylori and classic symptoms, antibiotics given, also in 20's  . Chronic back pain   . Chronic constipation   . Tremor   . MI (myocardial infarction)     multiple  . Seizure 2003    vs TIA   . 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 Hospice care     due to severe CHF 2003-probably due to Actos or Avandia    Past Surgical History  Procedure Laterality Date  . Cholecystectomy  09/02  . Aorto-femoral bypass graft  1997  . Angioplasty      Multiple  . Tubal ligation    . US echocardiography      EF 77%    Family History  Problem Relation Age of Onset  . Heart disease Mother   . Pulmonary fibrosis Sister   . Cancer Sister     breast  . Diabetes Brother     History   Social History  . Marital Status: Married    Spouse Name: N/A    Number of Children: 2  . Years of Education: N/A   Occupational History  . Retired  textile workder   Social History Main Topics  . Smoking status: Former Games developermoker  . Smokeless tobacco: Never Used  . Alcohol Use: No  . Drug Use: No  . Sexual Activity: Not on file   Other Topics Concern  . Not on file   Social History Narrative   Had been on hospice due to severe CHF in 2003--probably from actos      Has living will   Husband has health care POA   Has DNR   No tube feeds if cognitively unaware   Review of Systems Sleeps well bolwes fine Mood has been good-- wonders about weaning the paroxetine     Objective:   Physical Exam  Constitutional: She appears well-developed and well-nourished. No distress.  Neck: Normal range of motion. Neck supple. No thyromegaly present.  Cardiovascular: Normal rate, regular rhythm and normal heart sounds.  Exam reveals no gallop.   No murmur heard. Pulmonary/Chest: Effort normal and breath sounds normal. No respiratory distress. She has no wheezes. She has no rales.  Abdominal: Soft. There is no tenderness.  Musculoskeletal:  She exhibits no edema and no tenderness.  Lymphadenopathy:    She has no cervical adenopathy.  Psychiatric: She has a normal mood and affect. Her behavior is normal.          Assessment & Plan:

## 2013-09-08 NOTE — Assessment & Plan Note (Signed)
Mood has been fine Will try to wean the paroxetine

## 2013-09-08 NOTE — Assessment & Plan Note (Signed)
Stable respiratory status No changes needed

## 2013-09-08 NOTE — Progress Notes (Signed)
Pre visit review using our clinic review tool, if applicable. No additional management support is needed unless otherwise documented below in the visit note. 

## 2013-09-09 ENCOUNTER — Encounter: Payer: Self-pay | Admitting: Internal Medicine

## 2013-09-09 DIAGNOSIS — D696 Thrombocytopenia, unspecified: Secondary | ICD-10-CM | POA: Insufficient documentation

## 2013-09-14 ENCOUNTER — Encounter: Payer: Self-pay | Admitting: *Deleted

## 2013-10-12 ENCOUNTER — Encounter: Payer: Self-pay | Admitting: Internal Medicine

## 2013-12-06 ENCOUNTER — Other Ambulatory Visit: Payer: Self-pay | Admitting: *Deleted

## 2013-12-06 MED ORDER — MORPHINE SULFATE ER 60 MG PO TBCR: 60.0000 mg | EXTENDED_RELEASE_TABLET | Freq: Two times a day (BID) | ORAL | Status: DC

## 2013-12-06 NOTE — Telephone Encounter (Signed)
Spoke with patient and advised rx ready for pick-up and it will be at the front desk.  

## 2013-12-06 NOTE — Telephone Encounter (Signed)
Last 3 mth refill 4/9

## 2014-03-06 ENCOUNTER — Encounter: Payer: Self-pay | Admitting: Internal Medicine

## 2014-03-06 ENCOUNTER — Ambulatory Visit (INDEPENDENT_AMBULATORY_CARE_PROVIDER_SITE_OTHER): Payer: Medicare Other | Admitting: Internal Medicine

## 2014-03-06 VITALS — BP 120/82 | HR 70 | Temp 98.3°F | Resp 16 | Wt 116.8 lb

## 2014-03-06 DIAGNOSIS — I2510 Atherosclerotic heart disease of native coronary artery without angina pectoris: Secondary | ICD-10-CM

## 2014-03-06 DIAGNOSIS — M549 Dorsalgia, unspecified: Secondary | ICD-10-CM

## 2014-03-06 DIAGNOSIS — Z23 Encounter for immunization: Secondary | ICD-10-CM

## 2014-03-06 DIAGNOSIS — J449 Chronic obstructive pulmonary disease, unspecified: Secondary | ICD-10-CM

## 2014-03-06 DIAGNOSIS — F39 Unspecified mood [affective] disorder: Secondary | ICD-10-CM

## 2014-03-06 DIAGNOSIS — G8929 Other chronic pain: Secondary | ICD-10-CM

## 2014-03-06 DIAGNOSIS — K219 Gastro-esophageal reflux disease without esophagitis: Secondary | ICD-10-CM

## 2014-03-06 MED ORDER — MORPHINE SULFATE ER 60 MG PO TBCR: 60.0000 mg | EXTENDED_RELEASE_TABLET | Freq: Two times a day (BID) | ORAL | Status: DC

## 2014-03-06 NOTE — Assessment & Plan Note (Signed)
Doing well with the morphine

## 2014-03-06 NOTE — Assessment & Plan Note (Signed)
Stable Has been staying indoors--hopefully can get out with the colder weather Needs to pick up on her eating

## 2014-03-06 NOTE — Progress Notes (Signed)
Subjective:    Patient ID: Vanessa [name], female    DOB: 1945/10/28, 68 y.o.   MRN: [id number]  HPI Rough time over the summer Hot weather really affected her breathing Stayed in all the time--this worried her husband She feels better now with the colder weather  Had bronchial "mess" Chronic cough and gets some mucus Worse with lying down No fever No SOB at rest Husband still does the instrumental ADLs She still does ADLs though--has bench in shower  Mood okay--other than being stuck inside due to the heat Not anxious Appetite is okay she thinks--but she has lost considerable weight  Chronic back pain Gets along okay with the morphine  Still on omeprazole No regular heartburn No swallowing problems  Current Outpatient Prescriptions on File Prior to Visit  Medication Sig Dispense Refill  . albuterol (PROVENTIL) (2.5 MG/3ML) 0.083% nebulizer solution Take 3 mLs (2.5 mg total) by nebulization 4 (four) times daily as needed.  120 mL  1  . aspirin 81 MG tablet Take 81 mg by mouth daily.        . furosemide (LASIX) 40 MG tablet Take 1 tablet (40 mg total) by mouth daily as needed.  90 tablet  3  . morphine (MS CONTIN) 60 MG 12 hr tablet Take 1 tablet (60 mg total) by mouth 2 (two) times daily.  60 tablet  0  . morphine (MS CONTIN) 60 MG 12 hr tablet Take 1 tablet (60 mg total) by mouth 2 (two) times daily.  60 tablet  0  . morphine (MS CONTIN) 60 MG 12 hr tablet Take 1 tablet (60 mg total) by mouth 2 (two) times daily.  60 tablet  0  . omeprazole (PRILOSEC) 20 MG capsule Take 1 capsule (20 mg total) by mouth daily.  90 capsule  3  . ondansetron (ZOFRAN) 4 MG tablet TAKE 1 TABLET BY MOUTH THREE TIMES DAILY AS NEEDED FOR NAUSEA  30 tablet  0  . polyethylene glycol (MIRALAX) powder Take one capful with water two times a day       . senna-docusate (SENOKOT S) 8.6-50 MG per tablet Take 2 tablets by mouth 2 (two) times daily.         No current facility-administered medications on  file prior to visit.    No Known Allergies  Past Medical History  Diagnosis Date  . Congestive heart failure 05/03    ? diastolic (withSVT)  . COPD (chronic obstructive pulmonary disease)   . Coronary artery disease   . Depression   . GERD (gastroesophageal reflux disease)   . PVD (peripheral vascular disease)   . Peptic ulcer disease 2008    + H pylori and classic symptoms, antibiotics given, also in 20's  . Chronic back pain   . Chronic constipation   . Tremor   . MI (myocardial infarction)     multiple  . Seizure 2003    vs TIA   . 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 Hospice care     due to severe CHF 2003-probably due to Actos or Avandia  . Thrombocytopenia     Past Surgical History  Procedure Laterality Date  . Cholecystectomy  09/02  . Aorto-femoral bypass graft  1997  . Angioplasty      Multiple  . Tubal ligation    . [place] echocardiography      EF 77%    Family History  Problem Relation Age of Onset  . Heart disease Mother   . Pulmonary  fibrosis Sister   . Cancer Sister     breast  . Diabetes Brother     History   Social History  . Marital Status: Married    Spouse Name: N/A    Number of Children: 2  . Years of Education: N/A   Occupational History  . Retired     Forensic psychologist   Social History Main Topics  . Smoking status: Former Games developer  . Smokeless tobacco: Never Used  . Alcohol Use: No  . Drug Use: No  . Sexual Activity: Not on file   Other Topics Concern  . Not on file   Social History Narrative   Had been on hospice due to severe CHF in 2003--probably from actos      Has living will   Husband has health care POA   Has DNR   No tube feeds if cognitively unaware   Review of Systems Sleeps okay--still feels she is on a 3rd shift schedule after working it so many years Bowels okay Occasional nausea--ondansetron helps    Objective:   Physical Exam  Constitutional: She appears well-developed. No distress.  Clearly has lost some  weight in her face  Neck: Normal range of motion. No thyromegaly present.  Cardiovascular: Normal rate, regular rhythm and normal heart sounds.  Exam reveals no gallop.   No murmur heard. Pulmonary/Chest: Effort normal. No respiratory distress. She has no wheezes. She has no rales.  Decreased breath sounds but clear  Abdominal: Soft. She exhibits no distension. There is no tenderness. There is no rebound and no guarding.  Musculoskeletal: She exhibits no edema and no tenderness.  Lymphadenopathy:    She has no cervical adenopathy.  Psychiatric: She has a normal mood and affect. Her behavior is normal.          Assessment & Plan:

## 2014-03-06 NOTE — Assessment & Plan Note (Signed)
Quiet on PPI  Should continue due to her lung disease

## 2014-03-06 NOTE — Progress Notes (Signed)
Pre visit review using our clinic review tool, if applicable. No additional management support is needed unless otherwise documented below in the visit note. 

## 2014-03-06 NOTE — Assessment & Plan Note (Signed)
Mostly depressive problems but stable on the medication

## 2014-03-06 NOTE — Addendum Note (Signed)
Addended by: Melene PlanABDUL-MUTAKALLIM, TONIKA C on: 03/06/2014 04:08 PM   Modules accepted: Orders

## 2014-03-06 NOTE — Assessment & Plan Note (Signed)
No chest pain No beta blocker, ACEI due to hypotension No statin--was very sick on meds in past

## 2014-03-09 ENCOUNTER — Ambulatory Visit: Payer: Medicare Other | Admitting: Internal Medicine

## 2014-05-30 ENCOUNTER — Telehealth: Payer: Self-pay

## 2014-05-30 MED ORDER — MORPHINE SULFATE ER 60 MG PO TBCR: 60.0000 mg | EXTENDED_RELEASE_TABLET | Freq: Two times a day (BID) | ORAL | Status: DC

## 2014-05-30 NOTE — Telephone Encounter (Signed)
Spoke with patient and advised rx ready for pick-up and it will be at the front desk.  

## 2014-05-30 NOTE — Telephone Encounter (Signed)
Pt spouse called requesting status of rx.

## 2014-05-30 NOTE — Telephone Encounter (Signed)
V/M left requesting rx for morphine;call when ready for pick up ; request to pick up on 05/31/14. Last written 03/06/14 for 3 prescriptions.

## 2014-08-30 ENCOUNTER — Ambulatory Visit (INDEPENDENT_AMBULATORY_CARE_PROVIDER_SITE_OTHER): Payer: PPO | Admitting: Internal Medicine

## 2014-08-30 ENCOUNTER — Encounter: Payer: Self-pay | Admitting: Internal Medicine

## 2014-08-30 VITALS — BP 100/60 | HR 101 | Temp 97.6°F | Ht 66.0 in | Wt 101.0 lb

## 2014-08-30 DIAGNOSIS — Z7189 Other specified counseling: Secondary | ICD-10-CM | POA: Insufficient documentation

## 2014-08-30 DIAGNOSIS — E441 Mild protein-calorie malnutrition: Secondary | ICD-10-CM | POA: Diagnosis not present

## 2014-08-30 DIAGNOSIS — F3340 Major depressive disorder, recurrent, in remission, unspecified: Secondary | ICD-10-CM | POA: Diagnosis not present

## 2014-08-30 DIAGNOSIS — Z0000 Encounter for general adult medical examination without abnormal findings: Secondary | ICD-10-CM | POA: Diagnosis not present

## 2014-08-30 DIAGNOSIS — I5032 Chronic diastolic (congestive) heart failure: Secondary | ICD-10-CM | POA: Diagnosis not present

## 2014-08-30 DIAGNOSIS — J449 Chronic obstructive pulmonary disease, unspecified: Secondary | ICD-10-CM

## 2014-08-30 DIAGNOSIS — D696 Thrombocytopenia, unspecified: Secondary | ICD-10-CM

## 2014-08-30 DIAGNOSIS — I5030 Unspecified diastolic (congestive) heart failure: Secondary | ICD-10-CM | POA: Insufficient documentation

## 2014-08-30 LAB — CBC WITH DIFFERENTIAL/PLATELET
BASOS ABS: 0 10*3/uL (ref 0.0–0.1)
BASOS PCT: 0.4 % (ref 0.0–3.0)
EOS ABS: 0.1 10*3/uL (ref 0.0–0.7)
Eosinophils Relative: 1.6 % (ref 0.0–5.0)
HCT: 41.1 % (ref 36.0–46.0)
Hemoglobin: 13.8 g/dL (ref 12.0–15.0)
Lymphocytes Relative: 31.2 % (ref 12.0–46.0)
Lymphs Abs: 2.2 10*3/uL (ref 0.7–4.0)
MCHC: 33.5 g/dL (ref 30.0–36.0)
MCV: 101.4 fl — AB (ref 78.0–100.0)
Monocytes Absolute: 0.4 10*3/uL (ref 0.1–1.0)
Monocytes Relative: 6.1 % (ref 3.0–12.0)
Neutro Abs: 4.3 10*3/uL (ref 1.4–7.7)
Neutrophils Relative %: 60.7 % (ref 43.0–77.0)
Platelets: 146 10*3/uL — ABNORMAL LOW (ref 150.0–400.0)
RBC: 4.05 Mil/uL (ref 3.87–5.11)
RDW: 14.4 % (ref 11.5–15.5)
WBC: 7 10*3/uL (ref 4.0–10.5)

## 2014-08-30 LAB — COMPREHENSIVE METABOLIC PANEL
ALBUMIN: 3.6 g/dL (ref 3.5–5.2)
ALT: 9 U/L (ref 0–35)
AST: 19 U/L (ref 0–37)
Alkaline Phosphatase: 48 U/L (ref 39–117)
BUN: 7 mg/dL (ref 6–23)
CALCIUM: 9.2 mg/dL (ref 8.4–10.5)
CO2: 38 meq/L — AB (ref 19–32)
Chloride: 94 mEq/L — ABNORMAL LOW (ref 96–112)
Creatinine, Ser: 0.55 mg/dL (ref 0.40–1.20)
GFR: 116.96 mL/min (ref >60.00)
Glucose, Bld: 78 mg/dL (ref 70–99)
Potassium: 3.3 mEq/L — ABNORMAL LOW (ref 3.5–5.1)
SODIUM: 133 meq/L — AB (ref 135–145)
Total Bilirubin: 0.7 mg/dL (ref 0.2–1.2)
Total Protein: 6.8 g/dL (ref 6.0–8.3)

## 2014-08-30 LAB — T4, FREE: Free T4: 1.07 ng/dL (ref 0.60–1.60)

## 2014-08-30 MED ORDER — MORPHINE SULFATE ER 60 MG PO TBCR: 60.0000 mg | EXTENDED_RELEASE_TABLET | Freq: Two times a day (BID) | ORAL | Status: DC

## 2014-08-30 MED ORDER — METOPROLOL SUCCINATE ER 100 MG PO TB24: ORAL_TABLET | ORAL | Status: AC

## 2014-08-30 MED ORDER — OMEPRAZOLE 20 MG PO CPDR: 20.0000 mg | DELAYED_RELEASE_CAPSULE | Freq: Every day | ORAL | Status: AC

## 2014-08-30 MED ORDER — MORPHINE SULFATE ER 60 MG PO TBCR: 60.0000 mg | EXTENDED_RELEASE_TABLET | Freq: Two times a day (BID) | ORAL | Status: AC

## 2014-08-30 MED ORDER — ZOSTER VACCINE LIVE 19400 UNT/0.65ML ~~LOC~~ SOLR: 0.6500 mL | Freq: Once | SUBCUTANEOUS | Status: AC

## 2014-08-30 MED ORDER — PAROXETINE HCL 40 MG PO TABS: 20.0000 mg | ORAL_TABLET | Freq: Every day | ORAL | Status: AC

## 2014-08-30 NOTE — Assessment & Plan Note (Signed)
Will continue the medication indefinitely

## 2014-08-30 NOTE — Assessment & Plan Note (Signed)
Hasn't needed lasix lately Seems compensated and controlled without the actos (or avandia)

## 2014-08-30 NOTE — Patient Instructions (Signed)
Please have 1 can of boost with ice cream twice a day to help your weight.

## 2014-08-30 NOTE — Assessment & Plan Note (Signed)
No excessive bleeding Very mild Will recheck labs

## 2014-08-30 NOTE — Progress Notes (Signed)
Pre visit review using our clinic review tool, if applicable. No additional management support is needed unless otherwise documented below in the visit note. 

## 2014-08-30 NOTE — Assessment & Plan Note (Signed)
See social history Has long standing DNR

## 2014-08-30 NOTE — Progress Notes (Signed)
Subjective:    Patient ID: Vanessa [name], female    DOB: [birth year], 69 y.o.   MRN: [id number]  HPI Here for Medicare wellness visit and follow up of chronic conditions Reviewed advanced directives Doesn't see any other doctors other than dentist (now done with him since all teeth extracted) No tobacco now. No alcohol No regular exercise No sex in her marriage now No procedures or hospitalizations in past year Reviewed meds Doesn't drive. Does do her some of her cleaning but husband does shopping and heavy work. May have husband help her with shower (like drying off). Independent in other ADLs Vision and hearing are okay Has had multiple falls---fortunately no injuries She does not want any cancer screening. She hasn't noticed any sig memory issues  Breathing has been fine No regular cough On continuous oxygen Activities continue to be limited by the COPD though  No chest pain Occasional racing heart-- very brief No edema Sleeps with head of bed up--- no PND Mild dizziness at times but no syncope  No easy bruising No hematuria No easy bleeding  Chronic depression goes back decades Mood has been good lately No ongoing depressed mood lately Not really anxious  Past aorto bifem bypass No recent leg pain but limited activity level due to her COPD  Current Outpatient Prescriptions on File Prior to Visit  Medication Sig Dispense Refill  . aspirin 81 MG tablet Take 81 mg by mouth daily.      . furosemide (LASIX) 40 MG tablet Take 1 tablet (40 mg total) by mouth daily as needed. 90 tablet 3   No current facility-administered medications on file prior to visit.    No Known Allergies  Past Medical History  Diagnosis Date  . Congestive heart failure 05/03    ? diastolic (withSVT)  . COPD (chronic obstructive pulmonary disease)   . Coronary artery disease   . Depression   . GERD (gastroesophageal reflux disease)   . PVD (peripheral vascular disease)   . Peptic  ulcer disease 2008    + H pylori and classic symptoms, antibiotics given, also in 20's  . Chronic back pain   . Chronic constipation   . Tremor   . MI (myocardial infarction)     multiple  . Seizure 2003    vs TIA   . 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 Hospice care     due to severe CHF 2003-probably due to Actos or Avandia  . Thrombocytopenia     Past Surgical History  Procedure Laterality Date  . Cholecystectomy  09/02  . Aorto-femoral bypass graft  1997  . Angioplasty      Multiple  . Tubal ligation    . [place] echocardiography      EF 77%    Family History  Problem Relation Age of Onset  . Heart disease Mother   . Pulmonary fibrosis Sister   . Cancer Sister     breast  . Diabetes Brother     History   Social History  . Marital Status: Married    Spouse Name: N/A  . Number of Children: 2  . Years of Education: N/A   Occupational History  . Retired     Forensic psychologisttextile workder   Social History Main Topics  . Smoking status: Former Games developermoker  . Smokeless tobacco: Never Used  . Alcohol Use: No  . Drug Use: No  . Sexual Activity: Not on file   Other Topics Concern  . Not on file  Social History Narrative   Had been on hospice due to severe CHF in 2003--probably from actos      Has living will   Husband has health care POA   Has DNR   No tube feeds if cognitively unaware   Review of Systems Sleeps okay Thinks her appetite is okay--- didn't realize she lost so much weight Bowels are okay Back pain controlled on the morphine Voids okay    Objective:   Physical Exam  Constitutional: She is oriented to person, place, and time. No distress.  HENT:  Mouth/Throat: Oropharynx is clear and moist. No oropharyngeal exudate.  Upper denture in place  Neck: Normal range of motion. Neck supple. No thyromegaly present.  Cardiovascular: Normal rate, regular rhythm, normal heart sounds and intact distal pulses.  Exam reveals no gallop.   No murmur heard. Pulmonary/Chest: Effort  normal. No respiratory distress. She has no wheezes. She has no rales.  Decreased breath sounds but clear  Abdominal: Soft. There is no tenderness.  scaphoid  Lymphadenopathy:    She has no cervical adenopathy.  Neurological: She is alert and oriented to person, place, and time.  President -- "[place] [address] [place]" [id number] Recall 2/3  Resting tremor  Skin: No rash noted. No erythema.  Psychiatric: She has a normal mood and affect. Her behavior is normal.          Assessment & Plan:

## 2014-08-30 NOTE — Assessment & Plan Note (Signed)
I have personally reviewed the Medicare Annual Wellness questionnaire and have noted 1. The patient's medical and social history 2. Their use of alcohol, tobacco or illicit drugs 3. Their current medications and supplements 4. The patient's functional ability including ADL's, fall risks, home safety risks and hearing or visual             impairment. 5. Diet and physical activities 6. Evidence for depression or mood disorders  The patients weight, height, BMI and visual acuity have been recorded in the chart I have made referrals, counseling and provided education to the patient based review of the above and I have provided the pt with a written personalized care plan for preventive services.  I have provided you with a copy of your personalized plan for preventive services. Please take the time to review along with your updated medication list.  No cancer screening per her choice Yearly flu shot Rx for zostavax--she will consider

## 2014-08-30 NOTE — Assessment & Plan Note (Signed)
On continuous oxygen Limited in activity due to this but stable Mostly seems like emphysema lately

## 2014-08-30 NOTE — Assessment & Plan Note (Signed)
Lost 16# since last visit Will cycle some and still eating Discussed adding supplements 3 month follow up to reassess

## 2014-09-01 ENCOUNTER — Ambulatory Visit
Admit: 2014-09-01 | Disposition: A | Discharge status: Home / Self Care | Payer: Self-pay | Attending: Internal Medicine | Admitting: Internal Medicine

## 2014-09-05 ENCOUNTER — Encounter: Payer: Medicare Other | Admitting: Internal Medicine

## 2014-09-10 ENCOUNTER — Inpatient Hospital Stay
Admit: 2014-09-10 | Disposition: A | Discharge status: Hospice | Payer: Self-pay | Attending: Internal Medicine | Admitting: Internal Medicine

## 2014-09-10 LAB — COMPREHENSIVE METABOLIC PANEL
ALT: 12 U/L — AB
AST: 28 U/L
Albumin: 3.2 g/dL — ABNORMAL LOW
Alkaline Phosphatase: 45 U/L
Anion Gap: 13 (ref 7–16)
BILIRUBIN TOTAL: 1.7 mg/dL — AB
BUN: 12 mg/dL
CHLORIDE: 93 mmol/L — AB
Calcium, Total: 8.4 mg/dL — ABNORMAL LOW
Co2: 28 mmol/L
Creatinine: 0.5 mg/dL
EGFR (Non-African Amer.): >60
GLUCOSE: 100 mg/dL — AB
POTASSIUM: 3.3 mmol/L — AB
SODIUM: 134 mmol/L — AB
Total Protein: 6.9 g/dL

## 2014-09-10 LAB — CBC WITH DIFFERENTIAL/PLATELET
BASOS ABS: 0 10*3/uL (ref 0.0–0.1)
Basophil %: 0.2 %
Eosinophil #: 0 10*3/uL (ref 0.0–0.7)
Eosinophil %: 0.3 %
HCT: 39.5 % (ref 35.0–47.0)
HGB: 13.2 g/dL (ref 12.0–16.0)
LYMPHS ABS: 0.6 10*3/uL — AB (ref 1.0–3.6)
LYMPHS PCT: 14 %
MCH: 33.9 pg (ref 26.0–34.0)
MCHC: 33.4 g/dL (ref 32.0–36.0)
MCV: 102 fL — ABNORMAL HIGH (ref 80–100)
MONOS PCT: 12.2 %
Monocyte #: 0.5 x10 3/mm (ref 0.2–0.9)
NEUTROS PCT: 73.3 %
Neutrophil #: 3.1 10*3/uL (ref 1.4–6.5)
PLATELETS: 103 10*3/uL — AB (ref 150–440)
RBC: 3.89 10*6/uL (ref 3.80–5.20)
RDW: 13.5 % (ref 11.5–14.5)
WBC: 4.3 10*3/uL (ref 3.6–11.0)

## 2014-09-10 LAB — PHOSPHORUS: Phosphorus: 3.2 mg/dL

## 2014-09-10 LAB — LIPASE, BLOOD: Lipase: 43 U/L

## 2014-09-10 LAB — PROTIME-INR
INR: 1
PROTHROMBIN TIME: 13.2 s

## 2014-09-10 LAB — LACTIC ACID, PLASMA: Lactic Acid, Venous: 1.5 mmol/L

## 2014-09-10 LAB — TROPONIN I: Troponin-I: <0.03 ng/mL

## 2014-09-10 LAB — MAGNESIUM: Magnesium: 1.5 mg/dL — ABNORMAL LOW

## 2014-09-11 ENCOUNTER — Telehealth: Payer: Self-pay | Admitting: Internal Medicine

## 2014-09-11 LAB — CBC WITH DIFFERENTIAL/PLATELET
Bands: 10 %
HCT: 38.5 % (ref 35.0–47.0)
HGB: 12.8 g/dL (ref 12.0–16.0)
Lymphocytes: 15 %
MCH: 33.2 pg (ref 26.0–34.0)
MCHC: 33.1 g/dL (ref 32.0–36.0)
MCV: 100 fL (ref 80–100)
Monocytes: 4 %
RBC: 3.84 10*6/uL (ref 3.80–5.20)
RDW: 13 % (ref 11.5–14.5)
Segmented Neutrophils: 71 %
WBC: 4.1 10*3/uL (ref 3.6–11.0)

## 2014-09-11 LAB — URINALYSIS, COMPLETE
Bilirubin,UR: NEGATIVE
GLUCOSE, UR: NEGATIVE mg/dL (ref 0–75)
LEUKOCYTE ESTERASE: NEGATIVE
Nitrite: NEGATIVE
Ph: 5 (ref 4.5–8.0)
Protein: NEGATIVE
Specific Gravity: 1.023 (ref 1.003–1.030)

## 2014-09-11 LAB — BASIC METABOLIC PANEL
ANION GAP: 11 (ref 7–16)
BUN: 10 mg/dL
CO2: 26 mmol/L
Calcium, Total: 8.5 mg/dL — ABNORMAL LOW
Chloride: 98 mmol/L — ABNORMAL LOW
Creatinine: 0.38 mg/dL — ABNORMAL LOW
EGFR (Non-African Amer.): >60
GLUCOSE: 97 mg/dL
POTASSIUM: 4.3 mmol/L
SODIUM: 135 mmol/L

## 2014-09-11 LAB — MAGNESIUM: Magnesium: 1.8 mg/dL

## 2014-09-11 LAB — TSH: THYROID STIMULATING HORM: 0.646 u[IU]/mL

## 2014-09-11 NOTE — Telephone Encounter (Signed)
Confirm that she is just going home with hospice care---not to the hospice home

## 2014-09-11 NOTE — Telephone Encounter (Signed)
[name] to [facility] Dr[facility].

## 2014-09-11 NOTE — Telephone Encounter (Signed)
Discussed with husband Room 215 Dehydration and "a touch of pneumonia" Went down for echo today and they are talking to them about hospice again Will keep up with them

## 2014-09-11 NOTE — Telephone Encounter (Signed)
Pt is being discharged from hospital today with recommendation to go to hospice. Hospice has faxed referral form already. Please sign and fax back if you are in agreement with this. If you have any questions, please call Gavin PoundDeborah at [phone]. Thanks.

## 2014-09-12 NOTE — Telephone Encounter (Signed)
Patient is going home with hospice care.

## 2014-09-12 NOTE — Telephone Encounter (Signed)
Please confirm that they want me to come out for home visit--- and I can put her on my list (probably in a few weeks)

## 2014-09-12 NOTE — Telephone Encounter (Signed)
Patient's husband said he didn't know if she'd feel like coming to the office.    He said if you want her to come in to the office, he would have to wait a couple days and call back to schedule until he sees how she's feeling.  He said you had mentioned to him you might do a home visit. Please advise.

## 2014-09-12 NOTE — Telephone Encounter (Signed)
Please call her to set up follow up in the next couple of weeks

## 2014-09-12 NOTE — Telephone Encounter (Signed)
Okay I put her on the list

## 2014-09-12 NOTE — Telephone Encounter (Signed)
Patient's husband said to put her on the list for a home visit.  He said when he gets her home if he finds out he can bring her to the office, he'll let you know.

## 2014-09-13 ENCOUNTER — Telehealth: Payer: Self-pay

## 2014-09-13 NOTE — Telephone Encounter (Signed)
Sarah with [facility] left v/m; pt was admitted to hospice today after d/c from hospital for pneumonia with COPD; pts family said pt was getting neb treatment at hospital and request prn order for neb treatment if needed. Sarah request cb and 09/14/14 would be OK.

## 2014-09-14 NOTE — Telephone Encounter (Signed)
PRN neb Rx would be fine Let her know that I will plan to see her at home--probably in about 3 weeks

## 2014-09-14 NOTE — Telephone Encounter (Signed)
Spoke with nurse and advised results  

## 2014-09-14 NOTE — Telephone Encounter (Signed)
[facility] v/m cking status of neb order and request cb [phone].

## 2014-09-15 LAB — CULTURE, BLOOD (SINGLE)

## 2014-09-20 LAB — CULTURE, BLOOD (SINGLE)

## 2014-09-22 ENCOUNTER — Telehealth: Payer: Self-pay | Admitting: Internal Medicine

## 2014-09-22 MED ORDER — FUROSEMIDE 40 MG PO TABS: 40.0000 mg | ORAL_TABLET | Freq: Every day | ORAL | Status: AC | PRN

## 2014-09-22 NOTE — Telephone Encounter (Signed)
Please give 40 of lasix today and tomorrow- watch vital signs and call and update Dr Alphonsus SiasLetvak on Monday re: continued tx and checking potassium if needed  Please call it in if needed

## 2014-09-22 NOTE — Telephone Encounter (Signed)
Spoke to Hospice RN who assessed patient this morning.  She seemed to be doing well, better than last week.  Patient called her to report swelling in bilateral ankles that has worsened throughout the day.  I contacted the patient to follow up.  Patient states swelling started this morning.  She has been drinking well but has only voided x2 since waking this morning.  Patient is no longer taking the Lasix 40mg  (this was prescribed January 2015 and she does not recall ever taking it).  Patient questioning whether fluid pill is needed at this time or OV to evaluate swelling.  Please advise.  (Dr. Alphonsus SiasLetvak is out of the office.)

## 2014-09-22 NOTE — Telephone Encounter (Signed)
Hospice got a call from family stating that pts feet are swollen, they can not see her ankles.  Pt is on lasix 40 mg. Nurse states that pt was fine this am, looks better than she did last week.  Please call [phone] for more details from hospice nurse, thanks.

## 2014-09-22 NOTE — Telephone Encounter (Signed)
Rx sent to pharmacy (per Hospice) and Hospice Triage nurse notified of Dr. Royden Purlower's instructions and verbalized understanding

## 2014-09-23 NOTE — Telephone Encounter (Signed)
Please check on her on Monday I would only advise ongoing furosemide use if she is having AM swelling with any worsening of her breathing

## 2014-09-25 NOTE — Telephone Encounter (Signed)
Left message on VM with results, advised to call if any questions 

## 2014-09-26 ENCOUNTER — Encounter: Payer: Self-pay | Admitting: Internal Medicine

## 2014-09-26 ENCOUNTER — Ambulatory Visit: Payer: PPO | Admitting: Internal Medicine

## 2014-09-29 DIAGNOSIS — J189 Pneumonia, unspecified organism: Secondary | ICD-10-CM | POA: Diagnosis not present

## 2014-09-29 DIAGNOSIS — Z9981 Dependence on supplemental oxygen: Secondary | ICD-10-CM | POA: Diagnosis not present

## 2014-09-29 DIAGNOSIS — I2510 Atherosclerotic heart disease of native coronary artery without angina pectoris: Secondary | ICD-10-CM | POA: Diagnosis not present

## 2014-09-29 DIAGNOSIS — J441 Chronic obstructive pulmonary disease with (acute) exacerbation: Secondary | ICD-10-CM | POA: Diagnosis not present

## 2014-10-01 NOTE — Consult Note (Signed)
PATIENT NAME:  [name], [name] MR#:  [id number] DATE OF BIRTH:  [birth year]  DATE OF CONSULTATION:  09/11/2014  REFERRING PHYSICIAN:   CONSULTING PHYSICIAN:  Marcina MillaClaudette StaplerrdAlexander Neya Creegan, MD  PRIMARY CARE PHYSICIAN: Karie Schwalbeichard I. Letvak, M.D.   CARDIOLOGIST: Darlin PriestlyKenneth A. Lady GaryFath, M.D.   CHIEF COMPLAINT: Nausea and vomiting.   REASON FOR CONSULTATION: Consultation requested for evaluation for congestive heart failure.   HISTORY OF PRESENT ILLNESS: The patient is a 69 year old female with history of O2 dependent COPD, coronary artery disease, and peripheral vascular disease, who presents with a 2 to 3 day history of nausea, vomiting and decreased p.o. intake. The patient has had a recent history of progressive weight loss. [facility] where she had much decreased p.o. intake with nausea and vomiting. In the Emergency Room, chest x-ray revealed evidence for a right lower lobe pneumonia. The patient ruled out for myocardial infarction by CPK, isoenzymes, and troponin. The patient has continued to experience nausea with decreased p.o. intake.   PAST MEDICAL HISTORY: 1. Status post PCI ramus intermedius branch, [facility], December 2000.  2. [facility], 09/11/1999, via left brachial approach.  3. Hypertension.  4. History of TIA.  5. Hyperlipidemia.  6. COPD, O2 dependent.  7. Status post aortobifemoral bypass, 1997.   MEDICATIONS: Aspirin 81 mg daily, furosemide 40 mg daily, metoprolol 100 mg daily, paroxetine 40 mg daily, omeprazole 20 mg daily, morphine 60 mg q.12 h.   SOCIAL HISTORY: The patient is married, resides with her husband. She quit tobacco abuse 3 months ago.   FAMILY HISTORY: No immediate family history of myocardial infarction.   REVIEW OF SYSTEMS:   CONSTITUTIONAL: No fever or chills.  EYES: No blurry vision.  EARS: No hearing loss.  RESPIRATORY: Chronic shortness of breath.  CARDIOVASCULAR: No chest pain.  GASTROINTESTINAL: The patient has had recent  nausea and vomiting with decreased p.o. intake.  GENITOURINARY: No dysuria or hematuria.  HEMATOLOGICAL: No easy bruising or bleeding.  MUSCULOSKELETAL: No arthralgias or myalgias.  NEUROLOGICAL: No focal muscle weakness or numbness.  PSYCHOLOGICAL: No depression or anxiety.   PHYSICAL EXAMINATION:  VITAL SIGNS: Blood pressure 145/81, pulse 77, respirations 20, temperature 97.3, pulse oximetry 99%.  HEENT: Pupils equal, reactive to light and accommodation.  NECK: Supple without thyromegaly.  LUNGS: Clear.  HEART: Normal JVP. Normal PMI. Regular rate and rhythm. Normal S1, S2. No appreciable gallop, murmur, or rub.  ABDOMEN: Soft and nontender. Pulses were intact bilaterally.  MUSCULOSKELETAL: Normal muscle tone.  NEUROLOGIC: The patient is alert and oriented x 3. Motor and sensory are both grossly intact.   IMPRESSION: A 69 year old female with known coronary artery disease status post prior percutaneous coronary intervention, presents with a 2 to 3 day history of markedly reduced p.o. intake with nausea and vomiting with progressive weight loss. The patient has ruled out for myocardial infarction by CPK, isoenzymes, and troponin. She currently denies chest pain. The patient does not appear to be fluid overloaded or exhibiting signs or symptoms of congestive heart failure at this time.   RECOMMENDATIONS:  1. Agree with overall current therapy.  2. Defer full dose anticoagulation.  3. Hold diuretic for gentle IV hydration.  4. Consider 2-D echocardiogram if not done in the past 6 months.  5. Further recommendations pending the patient's initial clinical course.    ____________________________ Marcina MillardAlexander Sahara Fujimoto, MD ap:JT D: 09/11/2014 08:50:04 ET T: 09/11/2014 09:08:03 ET JOB#: [id number]  cc: Marcina MillardAlexander Micco Bourbeau, MD, <Dictator> Marcina MillardALEXANDER Kori Goins MD ELECTRONICALLY SIGNED  09/26/2014 17:16 

## 2014-10-01 NOTE — H&P (Signed)
PATIENT NAME:  Vanessa [name], Vanessa [name] MR#:  [id number] DATE OF BIRTH:  Oct 10, 1945  DATE OF ADMISSION:  09/10/2014  PRIMARY CARE PHYSICIAN: Karie Schwalbe, MD  PRIMARY CARDIOLOGIST: Darlin Priestly. Lady Gary, MD  REFERRING EMERGENCY ROOM PHYSICIAN: Sheryl L. Mindi Junker, MD    CHIEF COMPLAINT: Nausea and vomiting.   HISTORY OF PRESENT ILLNESS: This very pleasant 69 year old woman with past medical history of COPD on 2 L of oxygen at home, congestive heart failure, coronary artery disease, and peripheral vascular disease, presents with 2 to 3 days of nausea and vomiting. She reports that she has chronic nausea and vomiting for which she takes Zofran at home, and it is usually controlled. She has had progressive weight loss over the past 10 years of approximately 200 pounds and has continued to lose weight even over the past few months. She struggles to eat due to lack of appetite and chronic nausea. For the past 2 to 3 days, her nausea has become more severe. She has been unable to hold down water or food. She has had many episodes of vomiting; no hematemesis. Initially she had watery emesis and then bilious emesis, and now she is only having gagging. She has not had any diarrhea or abdominal pain. No fevers, chills, or sweats. No sick contacts. On presentation to the Emergency Room, she is profoundly weak and has pneumonia on chest x-ray.   PAST MEDICAL HISTORY:  1.  COPD with chronic respiratory failure requiring 2 L nasal cannula at home at all times. 2.  Congestive heart failure. No 2-D echocardiogram in this chart, ejection fraction is unknown.  3.  Coronary artery disease.  4.  Peripheral vascular disease.  5.  Weight loss over the past 10 years of 200 pounds.  6.  Chronic nausea.   SOCIAL HISTORY: She lives with her husband, her son, and her son's family. She does not use a cane or walker but is mostly bedbound due to chronic weakness. She is a former smoker. She quit 3 months ago, smoked one-third pack  of cigarettes per day for several decades. She does not drink alcohol or use any illicit substances.   REVIEW OF SYSTEMS:  CONSTITUTIONAL: Positive for fatigue, weakness, and weight loss.  HEENT: No change in vision or hearing. No pain in the eyes or ears. She does have a sore throat after vomiting, no difficulty swallowing. No sinus congestion.  RESPIRATORY: Positive for shortness of breath with exertion, wheezing, cough. No hemoptysis or sputum production. No painful respiration.  CARDIOVASCULAR: No chest pain, orthopnea, edema, or syncope.  GASTROINTESTINAL: Positive as above for nausea and vomiting. No diarrhea or abdominal pain. No hematemesis.  SKIN: No new rashes, lesions, or wounds.  MUSCULOSKELETAL: She has chronic back pain which is stable. No new pain in the neck, back, shoulders, knees, or hips. No gout.  ENDOCRINE: No hot or cold intolerance. No polyuria or polydipsia.  HEMATOLOGIC: No easy bruising or bleeding.  NEUROLOGIC: No focal numbness or weakness. No history of CVA or seizure. No headache.  PSYCHIATRIC: No bipolar disorder or schizophrenia.   HOME MEDICATIONS:  1.  Paroxetine 40 mg 1 tablet daily.  2.  Omeprazole 20 mg 1 capsule daily.  3.  Morphine 60 mg 12-hour tablet extended release, 1 tablet every 12 hours.  4.  Metoprolol 100 mg 1 tablet daily.  5.  Furosemide 40 mg 1 tablet daily.  6.  Aspirin 81 mg 1 tablet daily.   ALLERGIES: No known allergies.   PHYSICAL EXAMINATION:  VITAL SIGNS: Temperature 98.4, pulse 98, respirations 20, blood pressure 124/76, oxygenation 98% on room air.  GENERAL: She is chronically ill-appearing, cachectic, and weak.  HEENT: Pupils equal, round, and reactive to light. Conjunctivae clear, extraocular motion intact. Oral mucous membranes are pink and moist. She is edentulous. Posterior oropharynx is clear with no exudate, edema, or erythema.  NECK: Supple. Trachea midline. No cervical lymphadenopathy.  RESPIRATORY: Fair air movement  with expiratory wheezing. No respiratory distress. She does have an intermittent dry cough during conversation. No rhonchi or rales.  CARDIOVASCULAR: Tachycardic, irregular. She does have a 3/6 systolic ejection murmur. No peripheral edema. Peripheral pulses are 1+. No JVD. No bruit.  ABDOMEN: Soft, diffusely tender. No guarding, no rebound, no hepatosplenomegaly or mass noted.  SKIN: Dry with poor skin turgor. She has a stage 1 sacral ulcer. Otherwise, no rashes, lesions, or open wounds.  MUSCULOSKELETAL: No joint effusions. Range of motion normal. Strength is 4/5 throughout.  NEUROLOGIC: Cranial nerves II through XII grossly intact. Strength and sensation are intact and equal bilaterally. Nonfocal.  PSYCHIATRIC: She is alert and oriented and in good spirits. She has good insight into her clinical condition.   LABORATORY DATA: Sodium 134, potassium 2.3, chloride is 93, bicarbonate 28, BUN 12, creatinine 0.5, glucose 100, calcium is 8.4, phosphorus 3.2, magnesium 1.5, lipase 43. Lactic acid is 1.5. Total protein 6.9, albumin 3.2, bilirubin 1.7, alkaline phosphatase 45, AST 28, ALT 12. Troponin less than 0.03. White blood cells 4.3, hemoglobin 13.2, platelets 103,000, MCV is 102.  IMAGING: Chest x-ray shows patchy opacity in the right lung base concerning for pneumonia. Hyperinflation with emphysema.   ASSESSMENT AND PLAN:  1.  Nausea and vomiting: This is her presenting complaint. She has chronic nausea and vomiting, likely due to congestive heart failure. Home regimen of Zofran was not successful. We will provide intravenous Zofran inpatient and Phenergan if needed. Advance diet as tolerated.  2.  Pneumonia: This could be the cause of her nausea and vomiting. Blood cultures and sputum cultures have been ordered. She has received azithromycin and Rocephin in the Emergency Room. I will start Levaquin.  3.  Chronic obstructive pulmonary disease with chronic respiratory failure requiring 2 to 3 L of  oxygen at home: This seems to be stable, though she does have some wheezing on examination. We will treat with nebulizers during hospitalization. She did receive 125 mg of Solu-Medrol in the Emergency Room. At this point I will not continue with scheduled steroids. She can be reassessed in the morning for need for steroids. She is not on any home inhalers.  4.  Hypokalemia and hypomagnesemia: We will replace and recheck in the morning.  5.  Macrocytosis: We will check a B12 and TSH level.  6.  Weight loss: This is likely due to advanced chronic obstructive pulmonary disease and congestive heart failure. She tries to consume Ensure dietary supplements but has no appetite and does not tolerate this well. We will get a nutrition consult. Would consider adding mirtazapine to treat both depression and help with weight gain. Could also consider something like Marinol to help with weight gain. She has been working with her primary care physician on this issue.  7.  Congestive heart failure: It seems from her given history that this is an ischemic cardiomyopathy. She does not know her ejection fraction. We will continue with metoprolol and aspirin, but will hold her Lasix today in the setting of vomiting to avoid volume depletion.  8.  Gastroesophageal reflux  disease: Continue with proton pump inhibitor.   9.  Thrombocytopenia: This is likely chronic. We will continue to monitor. We will hold off on pharmacological deep vein thrombosis prophylaxis, just continue with aspirin and sequential compression devices. No signs of uncontrolled bleeding or bruising.   CODE STATUS: The patient is a DNR.  TIME SPENT ON ADMISSION: 45 minutes.    ____________________________ Ena Dawleyatherine P. Clent RidgesWalsh, MD cpw:ST D: 09/10/2014 22:00:36 ET T: 09/10/2014 22:39:01 ET JOB#: [id number]  cc: Santina Evansatherine P. Clent RidgesWalsh, MD, <Dictator> Gale JourneyATHERINE P WALSH MD ELECTRONICALLY SIGNED 09/19/2014 15:08

## 2014-10-01 NOTE — Discharge Summary (Signed)
PATIENT NAME:  Vanessa [name], KLUMP MR#:  [id number] DATE OF BIRTH:  Jun 30, 1945  DATE OF ADMISSION:  09/10/2014 DATE OF DISCHARGE:  09/13/2014  DISCHARGE DIAGNOSES: 1.  Chronic nausea and vomiting, no exact etiology known.  2.  Pneumonia.  3.  Chronic respiratory failure requiring 2 to 3 liters oxygen at baseline due to chronic obstructive pulmonary. 4.  Hypokalemia and hypomagnesemia.   SECONDARY DIAGNOSES:  1.  COPD with chronic respiratory failure requiring 2 L nasal cannula at home at all times. 2.  Congestive heart failure. No 2-D echocardiogram in this chart, ejection fraction is unknown.  3.  Coronary artery disease.  4.  Peripheral vascular disease.  5.  Weight loss over the past 10 years of 200 pounds.  6.  Chronic nausea.   CONSULTATIONS:  1.  Cardiology, Dr. Darrold Junker. 2.  Palliative Care.  PROCEDURES AND RADIOLOGY: A 2-D echocardiogram 11th of April showed normal LV systolic function. Mild mitral and tricuspid valve regurgitation.   Chest x-ray on 10th of April showed patchy opacity at the right lung base concerning for pneumonia. Emphysema seen.   MAJOR LABORATORY PANEL: Blood cultures grew Staphylococcus epidermidis in 1 out of 2 bottles, likely contaminant.  UA on 11th of April was negative. Serum vitamin B12 level was borderline low. Serum methylmalonic acid level was within normal limits.   HISTORY AND SHORT HOSPITAL COURSE: The patient is a 69 year old female with the above-mentioned medical problems who was admitted for nausea and vomiting with significant weight loss. Please see Dr. Jerrye [name] dictated history and physical for further details. Cardiology consultation was obtained with Dr. Marcina [name] for evaluation of CHF. He  recommended 2-D echocardiogram. This was obtained with results dictated above. Palliative care consultation was obtained with Dr. Harriett Sine [name] as the patient was noted to have chronic nausea and vomiting with ongoing weight loss  for almost last several years, up to 200 pounds within last 10 years. Physical therapy consultation was obtained and the patient was recommended home with hospice. The patient was in agreement with the same. The family was also in agreement and after discussion with palliative care the patient was discharged home on 13th of April in fair condition.   PERTINENT DISCHARGE PHYSICAL EXAMINATION: VITAL SIGNS: On the date of discharge, her temperature was 97.9, heart rate 71 per minute, respirations 18 per minute, blood pressure 110/66, and she was saturating 96% on 3 liters oxygen via nasal cannula.  CARDIOVASCULAR: S1, S2 normal. No murmurs, rubs or gallops.  LUNGS: Clear to auscultation bilaterally. No wheezing, rales, rhonchi, crepitation.  ABDOMEN: Soft, benign.  NEUROLOGIC: Nonfocal examination. All of the patient's physique was very weak and cachectic appearing. All other physical examination remained at baseline.   DISCHARGE MEDICATIONS:  Medication Instructions  aspir 81 81 mg oral tablet  1 tab(s) orally once a day   furosemide 40 mg oral tablet  1 tab(s) orally once a day   metoprolol 100 mg oral tablet, extended release  1 tab(s) orally once a day   morphine 60 mg/12 hours oral tablet, extended release  1 tab(s) orally every 12 hours   omeprazole 20 mg oral delayed release capsule  1 cap(s) orally once a day   paroxetine 40 mg oral tablet  1 tab(s) orally once a day   tylenol allergy multi-symptom nighttime  500 milligram(s)     prochlorperazine 5 mg oral tablet  1 tab(s) orally every 6 hours, As Needed, nausea, vomiting , As needed, nausea, vomiting   amoxicillin-clavulanate  875 milligram(s) orally 2 times a day x 5 days   boost breeze *  237 milliliter(s) orally 3 times a day (with meals)   olanzapine 2.5 mg oral tablet  1 tab(s) orally once a day     DISCHARGE DIET: Regular.   DISCHARGE ACTIVITY: As tolerated.   DISCHARGE INSTRUCTIONS AND FOLLOW-UP: The patient was instructed  to follow up with her primary care physician, Dr. Tillman Abideichard [name], in 1 to 2 weeks. She will need follow-up with Dr. Harold HedgeKenneth [name] who is her cardiologist in 2 to 4 weeks.   She will be followed by hospice services at home. Her code status was updated as DNR.   TOTAL TIME DISCHARGING THIS PATIENT: 45 minutes.  ____________________________ Ellamae SiaVipul S. Sherryll BurgerShah, MD vss:sb D: 09/14/2014 12:54:19 ET T: 09/14/2014 13:17:41 ET JOB#: [id number]  cc: Mitch Arquette S. Sherryll BurgerShah, MD, <Dictator> Karie Schwalbeichard I. Letvak, MD Darlin PriestlyKenneth A. Lady GaryFath, MD Ned GraceNancy Phifer, MD Ellamae SiaVIPUL S [facility] MD ELECTRONICALLY SIGNED 09/14/2014 14:39

## 2014-10-02 ENCOUNTER — Telehealth: Payer: Self-pay | Admitting: *Deleted

## 2014-10-02 MED ORDER — BENZONATATE 200 MG PO CAPS: 200.0000 mg | ORAL_CAPSULE | Freq: Three times a day (TID) | ORAL | Status: AC | PRN

## 2014-10-02 NOTE — Telephone Encounter (Signed)
Spoke to husband He doesn't think she is sick I sent Rx for benzonatate for cough  Let [name] know. Keep eye on her and if she worsens, can set up appt Please set up routine after hospital check in the next couple of weeks---she is not going to need home visits

## 2014-10-02 NOTE — Telephone Encounter (Signed)
Spoke with nurse and advised results  

## 2014-10-02 NOTE — Telephone Encounter (Signed)
Stephanie with hospice called stating that patient developed a cough over the weekend. [name] stated that the patient wants some strong cough medication and wants it to be orange flavored so that she can get it down. [name] is also requesting an antibiotic since patient is having a productive cough-green/yellow. Pharmacy [facility]  [name] is requesting a call back when this has been done.

## 2014-10-09 ENCOUNTER — Telehealth: Payer: Self-pay

## 2014-10-09 NOTE — Telephone Encounter (Signed)
Noted Will await her assessment She is supposed to have a follow up within the next 1-2 weeks here

## 2014-10-09 NOTE — Telephone Encounter (Signed)
Stephanie,nurse with [facility] left v/m; according to pts husband pt fell over the weekend with bruise on leg and arm hurts. [name] will see pt on 10/10/14. [name] wanted Dr Alphonsus SiasLetvak to be aware pt had fallen.

## 2014-10-26 ENCOUNTER — Other Ambulatory Visit: Payer: Self-pay | Admitting: *Deleted

## 2014-10-26 MED ORDER — MORPHINE SULFATE ER 60 MG PO TBCR: 60.0000 mg | EXTENDED_RELEASE_TABLET | Freq: Two times a day (BID) | ORAL | Status: AC

## 2014-10-26 NOTE — Telephone Encounter (Signed)
Stephanie with Hospice called the Triage line and requested a refill on pt's Rx, since pt is with hospice we can print and fax Rx to pharmacy on file as long as you put "hospice pt" on the Rx. [name] also said please only refill 2 weeks worth of medication since she is in hospice. Please call stephanie back once Rx has been sent

## 2014-10-26 NOTE — Telephone Encounter (Signed)
Rx faxed to local pharmacy 

## 2014-10-31 ENCOUNTER — Telehealth: Payer: Self-pay | Admitting: *Deleted

## 2014-10-31 NOTE — Telephone Encounter (Signed)
Hospice nurse calling stating that pt says her "breathing is ruff" nurse states lungs are clear and no cough, just pt says it's hard to breath. Nurse asking if a steroid could be called in? Prednisone?

## 2014-11-01 NOTE — Telephone Encounter (Signed)
Please set up an appt in the next couple of weeks I was supposed to see her since they didn't feel she needed home visits

## 2014-11-01 NOTE — Telephone Encounter (Signed)
Spoke with hospice nurse to advise I couldn't get thru to patient. She states they may be out of the house today, she states pt did not take her breathing treatment when normally she needs it 3 times daily. i advised pt would need to call for an appt if having problems, she states the husband is waiting for us to schedule a sooner appt, hospice nurse keeps telling him to call for appt and husband declines stating we will call him. I advised to her that if we don't know she's having problems then we can't call, hospice nurse states that they sometimes don't call them.

## 2014-11-01 NOTE — Telephone Encounter (Signed)
Spoke with patient's husband and advised results., he states pt is doing much better today after her breathing treatments, pt has appt at the end of the month and husband will call if earlier appt needed.

## 2014-11-01 NOTE — Telephone Encounter (Signed)
Please call her and see how she is doing. I think I need to see her--they were supposed to be coming in soon

## 2014-11-09 ENCOUNTER — Telehealth: Payer: Self-pay | Admitting: *Deleted

## 2014-11-09 ENCOUNTER — Other Ambulatory Visit: Payer: Self-pay

## 2014-11-09 NOTE — Telephone Encounter (Signed)
Left detailed message on voicemail to return call. 

## 2014-11-09 NOTE — Telephone Encounter (Signed)
Vanessa [name] with [facility] left v/m requesting refill morphine to [facility]. Last annual exam and last rx x 2 printed 08/30/14. Pt has enough med to last until first of week but Dr Alphonsus Sias does not return until 11/14/14.Please advise.

## 2014-11-10 MED ORDER — MORPHINE SULFATE ER 60 MG PO TBCR: 60.0000 mg | EXTENDED_RELEASE_TABLET | Freq: Two times a day (BID) | ORAL | Status: AC

## 2014-11-10 NOTE — Telephone Encounter (Signed)
Rx faxed to [facility] and [place] notified.

## 2014-11-10 NOTE — Telephone Encounter (Signed)
Printed to fax  

## 2014-11-17 ENCOUNTER — Telehealth: Payer: Self-pay | Admitting: Internal Medicine

## 2014-11-17 ENCOUNTER — Telehealth: Payer: Self-pay

## 2014-11-17 NOTE — Telephone Encounter (Signed)
Hospice called, pt was Just transferred to hospice home for symptom management, possibly end of life.  cb number is [phone].

## 2014-11-17 NOTE — Telephone Encounter (Signed)
Discussed with Judeth Cornfield Will try to reach the husband at hospice home later

## 2014-11-17 NOTE — Telephone Encounter (Signed)
Vanessa [name] with Hospice spoke with Vanessa [name] at front desk; pt is actively dying and request fax to [facility] for liquid morphine, 1 ml q 1 1/2 - 2 hours. Pt might have enough med for today but does not have enough med for weekend. Do not see liquid morphine on pts med list.Please advise.

## 2014-11-17 NOTE — Telephone Encounter (Signed)
No longer needs Rx Has been transferred to hospice home for symptom management

## 2014-11-21 ENCOUNTER — Telehealth: Payer: Self-pay | Admitting: Internal Medicine

## 2014-11-21 NOTE — Telephone Encounter (Signed)
Spoke to husband and expressed my condolences

## 2014-11-21 NOTE — Telephone Encounter (Signed)
Hospice called to let us know that pt passed away at 1121 last night. They will be faxing over a letter also.

## 2014-11-21 NOTE — Telephone Encounter (Signed)
That was expected 

## 2014-11-28 ENCOUNTER — Ambulatory Visit: Payer: PPO | Admitting: Internal Medicine

## 2014-12-01 DEATH — deceased

## 2014-12-05 ENCOUNTER — Ambulatory Visit: Payer: PPO | Admitting: Internal Medicine
